# Patient Record
Sex: Female | Born: 1970 | Race: White | Hispanic: Yes | Smoking: Former smoker
Health system: Southern US, Community
[De-identification: ages and names within clinical notes are randomized; demographics above are authoritative.]

## PROBLEM LIST (undated history)

## (undated) DIAGNOSIS — D509 Iron deficiency anemia, unspecified: Secondary | ICD-10-CM

## (undated) DIAGNOSIS — K5792 Diverticulitis of intestine, part unspecified, without perforation or abscess without bleeding: Secondary | ICD-10-CM

## (undated) DIAGNOSIS — Z8669 Personal history of other diseases of the nervous system and sense organs: Secondary | ICD-10-CM

## (undated) DIAGNOSIS — N840 Polyp of corpus uteri: Secondary | ICD-10-CM

## (undated) DIAGNOSIS — G510 Bell's palsy: Secondary | ICD-10-CM

## (undated) DIAGNOSIS — C439 Malignant melanoma of skin, unspecified: Secondary | ICD-10-CM

## (undated) DIAGNOSIS — Z86006 Personal history of melanoma in-situ: Secondary | ICD-10-CM

## (undated) DIAGNOSIS — Z8679 Personal history of other diseases of the circulatory system: Secondary | ICD-10-CM

## (undated) DIAGNOSIS — L309 Dermatitis, unspecified: Secondary | ICD-10-CM

## (undated) HISTORY — PX: ESOPHAGOGASTRODUODENOSCOPY: SHX1529

## (undated) HISTORY — DX: Dermatitis, unspecified: L30.9

## (undated) HISTORY — DX: Diverticulitis of intestine, part unspecified, without perforation or abscess without bleeding: K57.92

---
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
## 2018-10-18 LAB — LIPID PANEL
Cholesterol: 173 (ref 0–200)
HDL: 57 (ref 35–70)
LDL Cholesterol: 97
Triglycerides: 95 (ref 40–160)

## 2019-01-17 ENCOUNTER — Encounter: Payer: Self-pay | Admitting: Family Medicine

## 2019-01-17 ENCOUNTER — Ambulatory Visit (INDEPENDENT_AMBULATORY_CARE_PROVIDER_SITE_OTHER): Payer: Managed Care, Other (non HMO) | Admitting: Family Medicine

## 2019-01-17 ENCOUNTER — Other Ambulatory Visit: Payer: Self-pay

## 2019-01-17 DIAGNOSIS — Z85820 Personal history of malignant melanoma of skin: Secondary | ICD-10-CM | POA: Diagnosis not present

## 2019-01-17 DIAGNOSIS — C439 Malignant melanoma of skin, unspecified: Secondary | ICD-10-CM

## 2019-01-17 DIAGNOSIS — Z1239 Encounter for other screening for malignant neoplasm of breast: Secondary | ICD-10-CM

## 2019-01-17 NOTE — Progress Notes (Signed)
Virtual Visit via Video Note  I connected with Megan Kelley   on 01/17/19 at  4:00 PM EDT by a video enabled telemedicine application and verified that I am speaking with the correct person using two identifiers.  Location patient: home Location provider:work office Persons participating in the virtual visit: patient, provider  I discussed the limitations of evaluation and management by telemedicine and the availability of in person appointments. The patient expressed understanding and agreed to proceed.   Megan Kelley DOB: 1971/01/17 Encounter date: 01/17/2019  This is a 48 y.o. female who presents to establish care. No chief complaint on file.   History of present illness: Just moved here 16 months ago.   Saw derm about a year ago and ended up having melanoma on top of foot; ended up getting further surgery for complete excision. [place]. Has been checking q 3 months.   Feels like eczema has always been somewhat dormant until moving here to [place]. Has noted things on forearms, calves.   Last mammogram was about 5 years ago.  Last pap was similar time frame.   bloodwork done from Feb for wellness check. Not sure all of what was checked.   Does beachbody for exercise.     Past Medical History:  Diagnosis Date  . Eczema   . Melanoma ([place])    top of right foot-treated by dermatologist   Past Surgical History:  Procedure Laterality Date  . MELANOMA EXCISION     No Known Allergies Current Meds  Medication Sig  . PRESCRIPTION MEDICATION Topical cream for eczema ( cannot recall name)   Social History   Tobacco Use  . Smoking status: Former Smoker    Packs/day: 0.10    Years: 20.00    Pack years: 2.00    Types: Cigarettes  . Smokeless tobacco: Never Used  Substance Use Topics  . Alcohol use: Yes    Alcohol/week: 6.0 standard drinks    Types: 3 Glasses of wine, 3 Cans of beer per week    Comment: ocaasionally   Family History  Problem Relation Age of Onset  .  Pancreatic cancer Father   . Breast cancer Maternal Grandmother 43  . Diabetes Mother        borderline  . Eczema Brother   . Hepatitis C Brother   . Other Brother        suicide     Review of Systems  Constitutional: Negative for chills, fatigue and fever.  Respiratory: Negative for cough, chest tightness, shortness of breath and wheezing.   Cardiovascular: Negative for chest pain, palpitations and leg swelling.    Objective:  LMP 01/17/2019 (Exact Date)       BP Readings from Last 3 Encounters:  No data found for BP   Wt Readings from Last 3 Encounters:  No data found for Wt    EXAM:  GENERAL: alert, oriented, appears well and in no acute distress  HEENT: atraumatic, conjunctiva clear, no obvious abnormalities on inspection of external nose and ears  NECK: normal movements of the head and neck  LUNGS: on inspection no signs of respiratory distress, breathing rate appears normal, no obvious gross SOB, gasping or wheezing  CV: no obvious cyanosis  MS: moves all visible extremities without noticeable abnormality  PSYCH/NEURO: pleasant and cooperative, no obvious depression or anxiety, speech and thought processing grossly intact  SKIN: no obvious facial/neck abnormality  Assessment/Plan  1. Screening for breast cancer - [facility] DIGITAL SCREENING BILATERAL; Future  2. Melanoma of  skin ([place]) Has followed with derm.     Return for physical exam.   I discussed the assessment and treatment plan with the patient. The patient was provided an opportunity to ask questions and all were answered. The patient agreed with the plan and demonstrated an understanding of the instructions.   The patient was advised to call back or seek an in-person evaluation if the symptoms worsen or if the condition fails to improve as anticipated.  I provided 25 minutes of non-face-to-face time during this encounter.   Megan Rough, MD

## 2019-01-20 ENCOUNTER — Telehealth: Payer: Self-pay | Admitting: *Deleted

## 2019-01-20 NOTE — Telephone Encounter (Signed)
-----   Message from Caren Macadam, MD sent at 01/17/2019  4:39 PM EDT ----- Please set up in person physical in office in next few months.

## 2019-01-20 NOTE — Telephone Encounter (Signed)
I called the pt and scheduled a physical for 8/19.  I also called the pt back and left a detailed message with the number to call the [facility] at [phone] to schedule a mammogram and our fax number of [phone] in order to submit her insurance card.

## 2019-03-26 ENCOUNTER — Other Ambulatory Visit (HOSPITAL_COMMUNITY)
Admission: RE | Admit: 2019-03-26 | Discharge: 2019-03-26 | Disposition: A | Discharge status: Home / Self Care | Payer: Managed Care, Other (non HMO) | Source: Ambulatory Visit | Attending: Family Medicine | Admitting: Family Medicine

## 2019-03-26 ENCOUNTER — Other Ambulatory Visit: Payer: Self-pay

## 2019-03-26 ENCOUNTER — Ambulatory Visit
Admission: RE | Admit: 2019-03-26 | Discharge: 2019-03-26 | Disposition: A | Discharge status: Home / Self Care | Payer: 59 | Source: Ambulatory Visit | Attending: Family Medicine | Admitting: Family Medicine

## 2019-03-26 ENCOUNTER — Ambulatory Visit (INDEPENDENT_AMBULATORY_CARE_PROVIDER_SITE_OTHER): Payer: Managed Care, Other (non HMO) | Admitting: Family Medicine

## 2019-03-26 ENCOUNTER — Encounter: Payer: Self-pay | Admitting: Family Medicine

## 2019-03-26 VITALS — BP 124/68 | HR 96 | Temp 97.8°F | Ht 64.0 in | Wt 158.3 lb

## 2019-03-26 DIAGNOSIS — Z124 Encounter for screening for malignant neoplasm of cervix: Secondary | ICD-10-CM | POA: Diagnosis present

## 2019-03-26 DIAGNOSIS — C439 Malignant melanoma of skin, unspecified: Secondary | ICD-10-CM | POA: Diagnosis not present

## 2019-03-26 DIAGNOSIS — G514 Facial myokymia: Secondary | ICD-10-CM

## 2019-03-26 DIAGNOSIS — Z202 Contact with and (suspected) exposure to infections with a predominantly sexual mode of transmission: Secondary | ICD-10-CM

## 2019-03-26 DIAGNOSIS — Z1239 Encounter for other screening for malignant neoplasm of breast: Secondary | ICD-10-CM

## 2019-03-26 DIAGNOSIS — M2550 Pain in unspecified joint: Secondary | ICD-10-CM

## 2019-03-26 DIAGNOSIS — Z0000 Encounter for general adult medical examination without abnormal findings: Secondary | ICD-10-CM

## 2019-03-26 LAB — CBC WITH DIFFERENTIAL/PLATELET
Basophils Absolute: 0 10*3/uL (ref 0.0–0.1)
Basophils Relative: 0.7 % (ref 0.0–3.0)
Eosinophils Absolute: 0.1 10*3/uL (ref 0.0–0.7)
Eosinophils Relative: 1 % (ref 0.0–5.0)
HCT: 30.9 % — ABNORMAL LOW (ref 36.0–46.0)
Hemoglobin: 9.7 g/dL — ABNORMAL LOW (ref 12.0–15.0)
Lymphocytes Relative: 27.3 % (ref 12.0–46.0)
Lymphs Abs: 1.8 10*3/uL (ref 0.7–4.0)
MCHC: 31.4 g/dL (ref 30.0–36.0)
MCV: 68.1 fl — ABNORMAL LOW (ref 78.0–100.0)
Monocytes Absolute: 0.4 10*3/uL (ref 0.1–1.0)
Monocytes Relative: 6 % (ref 3.0–12.0)
Neutro Abs: 4.3 10*3/uL (ref 1.4–7.7)
Neutrophils Relative %: 65 % (ref 43.0–77.0)
Platelets: 428 10*3/uL — ABNORMAL HIGH (ref 150.0–400.0)
RBC: 4.54 Mil/uL (ref 3.87–5.11)
RDW: 17.1 % — ABNORMAL HIGH (ref 11.5–15.5)
WBC: 6.6 10*3/uL (ref 4.0–10.5)

## 2019-03-26 LAB — C-REACTIVE PROTEIN: CRP: <1 mg/dL (ref 0.5–20.0)

## 2019-03-26 LAB — VITAMIN B12: Vitamin B-12: 255 pg/mL (ref 211–911)

## 2019-03-26 LAB — TSH: TSH: 2.21 u[IU]/mL (ref 0.35–4.50)

## 2019-03-26 LAB — SEDIMENTATION RATE: Sed Rate: 18 mm/hr (ref 0–20)

## 2019-03-26 NOTE — Progress Notes (Signed)
Megan Kelley DOB: May 28, 1971 Encounter date: 03/26/2019  This is a 48 y.o. female who presents for complete physical   History of present illness/Additional concerns: GERD: triggered by diet/spicy foods. Not regular problem for her (more issue from [place])  Hx of htn - in 2018 lost 20lbs and has stayed about same weight since then. BP has been good since this.   Got bells palsy after flu shot in 2012. Hasn't had flu shot since. Sx for a month; left side of face.   More sinus sx since moving here.   Having more joint issues- some neck pain, jumps from joint to joint. Not red, no morning stiffness. Left hip does bother her. Feels like she gets more tender week before menses. Knee and ankle do not bother her much.   Past Medical History:  Diagnosis Date  . Bell's palsy 2012  . Eczema   . Melanoma ([place])    top of right foot-treated by dermatologist   Past Surgical History:  Procedure Laterality Date  . MELANOMA EXCISION     No Known Allergies Current Meds  Medication Sig  . PRESCRIPTION MEDICATION Topical cream for eczema ( cannot recall name)   Social History   Tobacco Use  . Smoking status: Former Smoker    Packs/day: 0.10    Years: 20.00    Pack years: 2.00    Types: Cigarettes  . Smokeless tobacco: Never Used  Substance Use Topics  . Alcohol use: Yes    Alcohol/week: 6.0 standard drinks    Types: 3 Glasses of wine, 3 Cans of beer per week    Comment: ocaasionally   Family History  Problem Relation Age of Onset  . Pancreatic cancer Father   . Cancer Father   . Breast cancer Maternal Grandmother 61  . Cancer Maternal Grandmother   . Diabetes Mother        borderline  . Alcohol abuse Mother   . Arthritis Mother   . Depression Mother   . Hypertension Mother   . Learning disabilities Mother   . Alcohol abuse Maternal Grandfather   . Eczema Brother   . Hepatitis C Brother   . Other Brother        suicide  . Depression Brother   . Drug abuse Brother    . Early death Brother   . Learning disabilities Brother      Review of Systems  Constitutional: Negative for activity change, appetite change, chills, fatigue, fever and unexpected weight change.  HENT: Negative for congestion, ear pain, hearing loss, sinus pressure, sinus pain, sore throat and trouble swallowing.   Eyes: Negative for pain and visual disturbance.  Respiratory: Negative for cough, chest tightness, shortness of breath and wheezing.   Cardiovascular: Negative for chest pain, palpitations and leg swelling.  Gastrointestinal: Negative for abdominal pain, blood in stool, constipation, diarrhea, nausea and vomiting.  Genitourinary: Negative for difficulty urinating and menstrual problem (periods are 7-8 days. Heavy first 1-2 with changing required q 2 hours. LMP was 2 weeks ago).  Musculoskeletal: Positive for arthralgias (shoulders, hips mostly) and neck pain (on and off). Negative for back pain.  Skin: Negative for rash.  Neurological: Negative for dizziness, weakness, numbness and headaches.  Hematological: Negative for adenopathy. Does not bruise/bleed easily.  Psychiatric/Behavioral: Negative for sleep disturbance and suicidal ideas. The patient is not nervous/anxious.        Has had some adjusting since moving here.     CBC:  Lab Results  Component Value  Date   WBC 6.6 03/26/2019   HGB 9.7 (L) 03/26/2019   HCT 30.9 (L) 03/26/2019   MCHC 31.4 03/26/2019   RDW 17.1 (H) 03/26/2019   PLT 428.0 (H) 03/26/2019   CMP:No results found for: NA, K, CL, CO2, ANIONGAP, GLUCOSE, BUN, CREATININE, LABGLOB, GFRAA, CALCIUM, PROT, AGRATIO, BILITOT, ALKPHOS, ALT, AST, GLOB LIPID:No results found for: CHOL, TRIG, HDL, LDLCALC, LABVLDL  Objective:  BP 124/68 (BP Location: Right Arm, Patient Position: Sitting, Cuff Size: Normal)   Pulse 96   Temp 97.8 F (36.6 C) (Temporal)   Ht 5\' 4"  (1.626 m)   Wt 158 lb 4.8 oz (71.8 kg)   SpO2 97%   BMI 27.17 kg/m   Weight: 158 lb 4.8 oz  (71.8 kg)   BP Readings from Last 3 Encounters:  03/26/19 124/68   Wt Readings from Last 3 Encounters:  03/26/19 158 lb 4.8 oz (71.8 kg)    Physical Exam Exam conducted with a chaperone present.  Genitourinary:    General: Normal vulva.     Exam position: Supine.     Labia:        Right: No rash, tenderness or lesion.        Left: No rash, tenderness or lesion.      Urethra: No prolapse, urethral pain, urethral swelling or urethral lesion.     Vagina: Normal.     Cervix: Discharge (clear, mucous) present.     Adnexa: Right adnexa normal.     Comments: Cervix difficult to see with small speculum due to vaginal wall weakness. Medium speculum made exam easier.   anteverted uterus, slightly enlarged. Psychiatric:     Comments: Has had a lot of changes since moving her from [place]. Not in current relationship, but is new to having females as sexual partners/relationships and so isn't sure how to completely navigate this new world. She has good friend group and support system. Daughter is also close by and good support system.      Assessment/Plan: Health Maintenance Due  Topic Date Due  . HIV Screening  03/25/1985  . TETANUS/TDAP  03/25/1989  . PAP SMEAR-Modifier  03/26/1991  . INFLUENZA VACCINE  03/08/2019   Health Maintenance reviewed. Completed mammogram this morning.   1. Well adult exam Keep up with daily exercise. Will call with bloodwork results once obtained. Avoiding flu shot due to ?rxn in past/bells palsy after injection.   2. Multiple joint pain - ANA; Future - CBC with Differential/Platelet; Future - C-reactive protein; Future - Sedimentation rate; Future - Rheumatoid factor; Future - Rheumatoid factor - Sedimentation rate - C-reactive protein - CBC with Differential/Platelet - ANA  3. Possible exposure to STD - HIV Antibody (routine testing w rflx); Future - RPR; Future - Hepatitis C antibody; Future - Hepatitis C antibody - RPR - HIV Antibody  (routine testing w rflx)  4. Melanoma of skin [facility]) Following with dermatology  5. Facial twitching Residual effect after bells palsy; make sure no other contributing factors. - TSH; Future - Vitamin B12; Future - Magnesium, RBC; Future - Magnesium, RBC - Vitamin B12 - TSH  6. Screening for cervical cancer - PAP [[place]]  Return pending bloodwork.Micheline Rough, MD

## 2019-03-27 ENCOUNTER — Other Ambulatory Visit (INDEPENDENT_AMBULATORY_CARE_PROVIDER_SITE_OTHER): Payer: Managed Care, Other (non HMO)

## 2019-03-27 ENCOUNTER — Other Ambulatory Visit: Payer: Managed Care, Other (non HMO)

## 2019-03-27 ENCOUNTER — Ambulatory Visit: Payer: Self-pay

## 2019-03-27 DIAGNOSIS — D649 Anemia, unspecified: Secondary | ICD-10-CM

## 2019-03-27 LAB — CYTOLOGY - PAP
Adequacy: ABSENT
Chlamydia: NEGATIVE
Diagnosis: NEGATIVE
HPV: NOT DETECTED
Neisseria Gonorrhea: NEGATIVE

## 2019-03-27 LAB — IBC + FERRITIN
Ferritin: 2.7 ng/mL — ABNORMAL LOW (ref 10.0–291.0)
Iron: 21 ug/dL — ABNORMAL LOW (ref 42–145)
Saturation Ratios: 3 % — ABNORMAL LOW (ref 20.0–50.0)
Transferrin: 494 mg/dL — ABNORMAL HIGH (ref 212.0–360.0)

## 2019-03-27 NOTE — Telephone Encounter (Signed)
r Provided mammogram result per Dr. Ethlyn Gallery . Voiced understanding.

## 2019-03-28 ENCOUNTER — Other Ambulatory Visit: Payer: Self-pay | Admitting: Family Medicine

## 2019-03-28 DIAGNOSIS — N924 Excessive bleeding in the premenopausal period: Secondary | ICD-10-CM

## 2019-03-28 LAB — HEPATITIS C ANTIBODY
Hepatitis C Ab: NONREACTIVE
SIGNAL TO CUT-OFF: 0.01

## 2019-03-28 LAB — RHEUMATOID FACTOR: Rhuematoid fact SerPl-aCnc: <14 IU/mL (ref <14)

## 2019-03-28 LAB — RPR: RPR Ser Ql: NONREACTIVE

## 2019-03-28 LAB — ANTI-NUCLEAR AB-TITER (ANA TITER): ANA Titer 1: 1:80 {titer} — ABNORMAL HIGH

## 2019-03-28 LAB — HIV ANTIBODY (ROUTINE TESTING W REFLEX): HIV 1&2 Ab, 4th Generation: NONREACTIVE

## 2019-03-28 LAB — MAGNESIUM, RBC: Magnesium RBC: 4.7 mg/dL (ref 4.0–6.4)

## 2019-03-28 LAB — ANA: Anti Nuclear Antibody (ANA): POSITIVE — AB

## 2019-03-28 MED ORDER — IRON (FERROUS SULFATE) 325 (65 FE) MG PO TABS: 1.0000 | ORAL_TABLET | Freq: Every day | ORAL | 0 refills | Status: DC

## 2019-03-28 NOTE — Progress Notes (Signed)
pel

## 2019-03-31 ENCOUNTER — Encounter: Payer: Self-pay | Admitting: Family Medicine

## 2019-04-07 ENCOUNTER — Ambulatory Visit
Admission: RE | Admit: 2019-04-07 | Discharge: 2019-04-07 | Disposition: A | Discharge status: Home / Self Care | Payer: Managed Care, Other (non HMO) | Source: Ambulatory Visit | Attending: Family Medicine | Admitting: Family Medicine

## 2019-04-07 DIAGNOSIS — N924 Excessive bleeding in the premenopausal period: Secondary | ICD-10-CM

## 2019-04-08 ENCOUNTER — Other Ambulatory Visit: Payer: Self-pay | Admitting: *Deleted

## 2019-04-08 ENCOUNTER — Encounter: Payer: Self-pay | Admitting: *Deleted

## 2019-04-08 DIAGNOSIS — D259 Leiomyoma of uterus, unspecified: Secondary | ICD-10-CM

## 2019-04-09 ENCOUNTER — Telehealth: Payer: Self-pay | Admitting: Family Medicine

## 2019-04-09 NOTE — Telephone Encounter (Signed)
Referral placed on 9/1 and patient was informed of this.

## 2019-04-09 NOTE — Telephone Encounter (Signed)
Reviewed lab results and physician's note with patient. Also, reviewed ultrasound note from provider, in detail. Has started the 325 mg ferrous sulfate supplement and will wait for GYN call.  Referral for gynecology needed. Routing to provider.    Error with Pryor Curia of this encounter-s/b Ihor Gully, RN Triage.

## 2019-04-18 ENCOUNTER — Telehealth: Payer: Self-pay | Admitting: Family Medicine

## 2019-04-18 NOTE — Telephone Encounter (Signed)
°  [facility]: left message for patient to call and schedule.  I called the patient on 9/11 [phone]) left msg to call the office no answer   Copied from [place] [id number]. Topic: Referral - Status >> Apr 16, 2019  9:37 AM Reyne Dumas L wrote: Reason for CRM:   Pt calling to see where her referral to the gynecologist stands. Pt can be reached at [phone]

## 2019-04-28 ENCOUNTER — Other Ambulatory Visit: Payer: Self-pay

## 2019-04-29 ENCOUNTER — Ambulatory Visit (INDEPENDENT_AMBULATORY_CARE_PROVIDER_SITE_OTHER): Payer: Managed Care, Other (non HMO) | Admitting: Obstetrics & Gynecology

## 2019-04-29 ENCOUNTER — Other Ambulatory Visit: Payer: Self-pay

## 2019-04-29 ENCOUNTER — Encounter: Payer: Self-pay | Admitting: Obstetrics & Gynecology

## 2019-04-29 VITALS — BP 140/80 | Ht 64.0 in | Wt 158.0 lb

## 2019-04-29 DIAGNOSIS — N920 Excessive and frequent menstruation with regular cycle: Secondary | ICD-10-CM | POA: Diagnosis not present

## 2019-04-29 DIAGNOSIS — D649 Anemia, unspecified: Secondary | ICD-10-CM

## 2019-04-29 DIAGNOSIS — R9389 Abnormal findings on diagnostic imaging of other specified body structures: Secondary | ICD-10-CM | POA: Diagnosis not present

## 2019-04-29 DIAGNOSIS — D219 Benign neoplasm of connective and other soft tissue, unspecified: Secondary | ICD-10-CM

## 2019-04-29 MED ORDER — NORETHINDRONE 0.35 MG PO TABS: 1.0000 | ORAL_TABLET | Freq: Every day | ORAL | 4 refills | Status: DC

## 2019-04-29 NOTE — Progress Notes (Signed)
Megan Kelley 11/21/1970 [id number]   History:    48 y.o. G1P1L1 Single  RP:  New patient presenting for Fibroids/Thickened Endometrium/Menorrhagia/Anemia  HPI: Heavy menstrual periods with secondary anemia.  Received IV Iron/currently on Iron tablets.  LMP 04/18/2019.  Pelvic US 04/07/2019 showed Fibroids, including provable SM Fibroid and Thickened Endometrial line.  Hb at 9.7 on 03/26/2019.  Past medical history,surgical history, family history and social history were all reviewed and documented in the EPIC chart.  Gynecologic History Patient's last menstrual period was 04/18/2019. Contraception: Same sex partners Last Pap: 03/2019.  Results were: Negative Last mammogram: 03/2019. Results were: Negative Bone Density: Never Colonoscopy: Never  Obstetric History OB History  Gravida Para Term Preterm AB Living  1 1       1   SAB TAB Ectopic Multiple Live Births               # Outcome Date GA Lbr Len/2nd Weight Sex Delivery Anes PTL Lv  1 Para              ROS: A ROS was performed and pertinent positives and negatives are included in the history.  GENERAL: No fevers or chills. HEENT: No change in vision, no earache, sore throat or sinus congestion. NECK: No pain or stiffness. CARDIOVASCULAR: No chest pain or pressure. No palpitations. PULMONARY: No shortness of breath, cough or wheeze. GASTROINTESTINAL: No abdominal pain, nausea, vomiting or diarrhea, melena or bright red blood per rectum. GENITOURINARY: No urinary frequency, urgency, hesitancy or dysuria. MUSCULOSKELETAL: No joint or muscle pain, no back pain, no recent trauma. DERMATOLOGIC: No rash, no itching, no lesions. ENDOCRINE: No polyuria, polydipsia, no heat or cold intolerance. No recent change in weight. HEMATOLOGICAL: No anemia or easy bruising or bleeding. NEUROLOGIC: No headache, seizures, numbness, tingling or weakness. PSYCHIATRIC: No depression, no loss of interest in normal activity or change in sleep pattern.     Exam:   BP 140/80   Ht 5\' 4"  (1.626 m)   Wt 158 lb (71.7 kg)   LMP 04/18/2019   BMI 27.12 kg/m   Body mass index is 27.12 kg/m.  General appearance : Well developed well nourished female. No acute distress  Pelvic: Vulva: Normal             Vagina: No gross lesions or discharge  Cervix: No gross lesions or discharge  Uterus  AV, increased in size 12-14 cm, nodular, non-tender and mobile  Adnexa  Without masses or tenderness  Anus: Normal  Pelvic US 04/07/2019:  Uterus  Measurements: 14.5 x 7.5 x 7.8 cm = volume: 441 mL. Heterogeneous myometrial echogenicity. Nabothian cyst at cervix. Multiple small uterine nodules are identified consistent with leiomyomata. These include a small posterior leiomyoma 2.8 x 2.3 x 2.4 cm which extend submucosal, and anterior RIGHT leiomyoma 2.0 x 2.1 x 1.9 cm, a 3.1 x 2.6 x 2.8 cm RIGHT lateral leiomyoma which extends submucosal, and a mid posterior subserosal leiomyoma 2.7 x 2.5 x 3.1 cm.  Endometrium  Thickness: 21 mm. Abnormal thickened and heterogeneous in appearance. No endometrial fluid.  Right ovary  Measurements: 4.1 x 2.7 x 3.0 cm = volume: 17.2 mL. Probable small hemorrhagic cyst 2.3 x 1.8 x 1.9 cm lacking internal blood flow on color Doppler imaging. No additional masses.  Left ovary  Measurements: 2.7 x 1.7 x 2.2 cm = volume: 5.3 mL. Normal morphology without mass  Other findings  No free pelvic fluid.  No adnexal masses.  IMPRESSION: Multiple small  uterine leiomyomata, 2 of which extend submucosal.  Abnormal thickening and heterogeneity of the endometrial complex measuring 21 mm.  If bleeding remains unresponsive to hormonal or medical therapy, focal lesion work-up with sonohysterogram should be considered. Endometrial biopsy should also be considered in pre-menopausal patients at high risk for endometrial carcinoma. (Ref: Radiological Reasoning: Algorithmic Workup of Abnormal Vaginal Bleeding  with Endovaginal Sonography and Sonohysterography. AJR 2008; [id number])  2.3 cm diameter suspected hemorrhagic cyst of the RIGHT ovary; short-interval follow up ultrasound in 6-12 weeks is recommended, preferably during the week following the patient's normal menses.  Hb 03/26/19 at 9.7   Assessment/Plan:  48 y.o. female for annual exam   1. Fibroids Small uterine fibroids including 2 fibroids with probable SM component.  Menorrhagia with secondary anemia on Iron supplement.  Will start on the Progestin only BCPs to control the menstrual flow.  No contraindication to the Progestin pill.  Usage/risks/benefits reviewed.  F/U with a Sonohysterogram to complete the investigation.  EBx as needed.  May need surgery if SM Myomas are confirmed.    2. Menorrhagia with regular cycle As above. - [place] Sonohysterogram; Future  3. Secondary anemia Continue on Iron tablets.  Adding a Progestin pill to decrease the menstrual flow.  Will repeat a CBC at f/u Sonohysto. - CBC future  4. Thickened endometrium Probable SM Fibroids.  R/O Polyps and Endometrial Hyperplasia/Ca.  F/U Sonohysto/Possible EBx. - [place] Sonohysterogram; Future  Other orders - ibuprofen (ADVIL) 200 MG tablet; Take 200 mg by mouth every 6 (six) hours as needed. - norethindrone (MICRONOR) 0.35 MG tablet; Take 1 tablet (0.35 mg total) by mouth daily.  Counseling on above issues and coordination of care >50% x 45 minutes.  Princess Bruins MD, 8:45 AM 04/29/2019

## 2019-04-30 ENCOUNTER — Encounter: Payer: Self-pay | Admitting: Obstetrics & Gynecology

## 2019-04-30 NOTE — Patient Instructions (Signed)
1. Fibroids Small uterine fibroids including 2 fibroids with probable SM component.  Menorrhagia with secondary anemia on Iron supplement.  Will start on the Progestin only BCPs to control the menstrual flow.  No contraindication to the Progestin pill.  Usage/risks/benefits reviewed.  F/U with a Sonohysterogram to complete the investigation.  EBx as needed.  May need surgery if SM Myomas are confirmed.    2. Menorrhagia with regular cycle As above. - [place] Sonohysterogram; Future  3. Secondary anemia Continue on Iron tablets.  Adding a Progestin pill to decrease the menstrual flow.  Will repeat a CBC at f/u Sonohysto. - CBC future  4. Thickened endometrium Probable SM Fibroids.  R/O Polyps and Endometrial Hyperplasia/Ca.  F/U Sonohysto/Possible EBx. - [place] Sonohysterogram; Future  Other orders - ibuprofen (ADVIL) 200 MG tablet; Take 200 mg by mouth every 6 (six) hours as needed. - norethindrone (MICRONOR) 0.35 MG tablet; Take 1 tablet (0.35 mg total) by mouth daily.  Michiel Cowboy, [place] un placer encontrarle hoy!

## 2019-05-26 ENCOUNTER — Other Ambulatory Visit: Payer: Self-pay

## 2019-05-27 ENCOUNTER — Ambulatory Visit (INDEPENDENT_AMBULATORY_CARE_PROVIDER_SITE_OTHER): Payer: Managed Care, Other (non HMO)

## 2019-05-27 ENCOUNTER — Other Ambulatory Visit: Payer: Self-pay | Admitting: Obstetrics & Gynecology

## 2019-05-27 ENCOUNTER — Ambulatory Visit (INDEPENDENT_AMBULATORY_CARE_PROVIDER_SITE_OTHER): Payer: Managed Care, Other (non HMO) | Admitting: Obstetrics & Gynecology

## 2019-05-27 ENCOUNTER — Telehealth: Payer: Self-pay

## 2019-05-27 ENCOUNTER — Encounter: Payer: Self-pay | Admitting: Obstetrics & Gynecology

## 2019-05-27 DIAGNOSIS — R9389 Abnormal findings on diagnostic imaging of other specified body structures: Secondary | ICD-10-CM | POA: Diagnosis not present

## 2019-05-27 DIAGNOSIS — N840 Polyp of corpus uteri: Secondary | ICD-10-CM

## 2019-05-27 DIAGNOSIS — D219 Benign neoplasm of connective and other soft tissue, unspecified: Secondary | ICD-10-CM

## 2019-05-27 DIAGNOSIS — N920 Excessive and frequent menstruation with regular cycle: Secondary | ICD-10-CM | POA: Diagnosis not present

## 2019-05-27 NOTE — Telephone Encounter (Signed)
Left message for patient to call me

## 2019-05-27 NOTE — Progress Notes (Signed)
    Megan Kelley 02/27/1971 [id number]        48 y.o.  G1P1L1 Single  RP: Menorrhagia/thickened endometrium for Sonohysto  HPI: Heavy menstrual periods with secondary anemia.  Received IV Iron/currently on Iron tablets.  LMP 04/18/2019.  Pelvic US 04/07/2019 showed Fibroids, including provable SM Fibroid and Thickened Endometrial line.  Hb at 9.7 on 03/26/2019.  Started on the Progestin-only BCP on 04/29/2019, no heavy bleeding since then.  No pelvic pain.   OB History  Gravida Para Term Preterm AB Living  1 1       1   SAB TAB Ectopic Multiple Live Births               # Outcome Date GA Lbr Len/2nd Weight Sex Delivery Anes PTL Lv  1 Para             Past medical history,surgical history, problem list, medications, allergies, family history and social history were all reviewed and documented in the EPIC chart.   Directed ROS with pertinent positives and negatives documented in the history of present illness/assessment and plan.  Exam:  There were no vitals filed for this visit. General appearance:  Normal  Pelvic US today: T/V and T/a images.  Comparison made with the outside pelvic ultrasound from April 07, 2019.  Enlarged anteverted uterus with several intramural and subserosal fibroids.  The overall uterine size is measured at 13.17 x 7.55 x 6.17 cm.  7 fibroids were measured the largest of which was 3.63 cm x 2.87 cm.  A 3.5 cm is pedunculated into the right adnexa and a 0.9 cm fibroid is adjacent to the endometrial cavity, possibly partly submucosal.  The endometrial lining is thinner than on the previous scan at 6.9 millimeter today.  A focal 1 cm echogenic mass is visualized in the lower uterine segment with a feeder vessel posteriorly.  Both ovaries are small with sparse follicles.  No free fluid in the posterior to the sac.   Assessment/Plan:  48 y.o. G1P1   1. Endometrial polyp Probable endometrial polyp measuring 1 cm with a feeder vessel posteriorly.  The lesion was  very well visualized by ultrasound and a sonohysterogram was not necessary.  The endometrial lining was otherwise normal.  The decision was made to proceed with a hysteroscopy, MyoSure excision of the endometrial polyp/possible excision of the submucosal fibroid and D&C.  A pamphlet and information were given to the patient.  She will follow-up for preop visit.  2. Menorrhagia with regular cycle Controled on the Progestin-only BCPs.  3. Fibroids IM Fibroids with a small possible partially SM Fibroid.  Counseling on above issues and coordination of care more than 50% for 15 minutes.  Princess Bruins MD, 9:04 AM 05/27/2019

## 2019-05-27 NOTE — Patient Instructions (Signed)
1. Endometrial polyp Probable endometrial polyp measuring 1 cm with a feeder vessel posteriorly.  The lesion was very well visualized by ultrasound and a sonohysterogram was not necessary.  The endometrial lining was otherwise normal.  The decision was made to proceed with a hysteroscopy, MyoSure excision of the endometrial polyp/possible excision of the submucosal fibroid and D&C.  A pamphlet and information were given to the patient.  She will follow-up for preop visit.  2. Menorrhagia with regular cycle Controled on the Progestin-only BCPs.  3. Fibroids IM Fibroids with a small possible partially SM Fibroid.  Megan Kelley, it was a pleasure seeing you today!

## 2019-05-28 NOTE — Telephone Encounter (Signed)
I called patient again and left another message. We have played "phone tag" several times. I had already told her and I reminded her that I am off Th/Fri. I told her that the available surgery times will remain the same until Monday when I can talk with her.  I told her I want to discuss her ins and what her prepymt amount is. I advised her Covid screen 4 days prior and has to quarantine until surgery after that. I gave her Nov 18 and Dec 14 as they are my next available dates for Dr. Marguerita Merles .  I told her to consider and I will talk with her on Monday.

## 2019-06-02 NOTE — Telephone Encounter (Signed)
Patient called earlier in my voice mail but said she would only be available until 10:45am. She states she is only available "when I am on the road".  I had just returned from being off Th/Fri so was not able to schedule surgery first thing this morning.  I called her this afternoon and asked her let me know if she wants to try to do this through voice mail. I can leave details and she can call me back.

## 2019-06-03 NOTE — Telephone Encounter (Signed)
I spoke with patient this morning and discussed her insurance benefits and her estimated surgery prepymt.  She has a very large deductible not yet met and after discussion thinks she would prefer to wait until early Jan to have surgery when she could meet deductible for the year.  Also, her open enrollment for insurance at work is coming up and she may change to a lesser deductible plan. I advised her as soon as she knows about changes in insurance to let me know and I will recalculated prepymt due.  I will call her as soon as I get the January schedule.

## 2019-07-08 ENCOUNTER — Other Ambulatory Visit: Payer: Self-pay

## 2019-07-08 ENCOUNTER — Telehealth: Payer: Self-pay

## 2019-07-08 NOTE — Telephone Encounter (Signed)
Spoke with patient and scheduled surgery for January. I reminded her of our previous conversation regarding estimated surgery prepayment due by one week prior to surgery. She said her insurance did not change.  I scheduled her for 08/12/2019 and scheduled her Covid screen accordingly. She was advised regarding need to quarantine from time of screening test until surgery.  Pre op with Dr. Marguerita Merles will be scheduled and Packet will be sent.

## 2019-07-21 ENCOUNTER — Encounter: Payer: Self-pay | Admitting: Anesthesiology

## 2019-08-04 ENCOUNTER — Encounter (HOSPITAL_BASED_OUTPATIENT_CLINIC_OR_DEPARTMENT_OTHER): Payer: Self-pay | Admitting: Obstetrics & Gynecology

## 2019-08-04 ENCOUNTER — Other Ambulatory Visit: Payer: Self-pay

## 2019-08-05 ENCOUNTER — Ambulatory Visit (INDEPENDENT_AMBULATORY_CARE_PROVIDER_SITE_OTHER): Payer: Managed Care, Other (non HMO) | Admitting: Obstetrics & Gynecology

## 2019-08-05 ENCOUNTER — Encounter: Payer: Self-pay | Admitting: Obstetrics & Gynecology

## 2019-08-05 DIAGNOSIS — D649 Anemia, unspecified: Secondary | ICD-10-CM | POA: Diagnosis not present

## 2019-08-05 DIAGNOSIS — D219 Benign neoplasm of connective and other soft tissue, unspecified: Secondary | ICD-10-CM

## 2019-08-05 DIAGNOSIS — N920 Excessive and frequent menstruation with regular cycle: Secondary | ICD-10-CM

## 2019-08-05 DIAGNOSIS — N840 Polyp of corpus uteri: Secondary | ICD-10-CM | POA: Diagnosis not present

## 2019-08-05 MED ORDER — NORETHINDRONE 0.35 MG PO TABS: 1.0000 | ORAL_TABLET | Freq: Every day | ORAL | 1 refills | Status: DC

## 2019-08-05 NOTE — Progress Notes (Signed)
Megan Kelley [birth year] [id number]        48 y.o.  G1P1L1 Televisit by phone.  Patient well identified.  Counseling between patient at her home and myself in my [place] office.  Counseling for 25 minutes.  RP: Preop Menorrhagia/Endometrial Polyp for HSC/Myosure Excision/D+C  HPI: Menorrhagia with Uterine Fibroids and an Endometrial Polyp per Pelvic US.  Secondary anemia.  On the Progestin Pill and Iron Sulfate supplements, but taking the iron pill only 3 x per week.  LMP recently was heavy again.  No current bleeding, no pelvic pain.  Intermittent hip/leg pain, more with her heavy menses, recommended to see an Orthopedist if persists after surgery.   OB History  Gravida Para Term Preterm AB Living  1 1       1   SAB TAB Ectopic Multiple Live Births               # Outcome Date GA Lbr Len/2nd Weight Sex Delivery Anes PTL Lv  1 Para             Past medical history,surgical history, problem list, medications, allergies, family history and social history were all reviewed and documented in the EPIC chart.   Directed ROS with pertinent positives and negatives documented in the history of present illness/assessment and plan.  Exam:  There were no vitals filed for this visit.  Pelvic US 05/27/19: T/V and T/A images. Comparison made with the outsidepelvic ultrasoundfromAugust 31, 2020. Enlarged anteverted uterus with several intramural and subserosal fibroids. The overall uterine size is measured at 13.17 x 7.55 x 6.17 cm. 7 fibroids were measured the largest of which was 3.63 cm x 2.87 cm. A3.5 cm is pedunculated into the right adnexa and a 0.9 cm fibroid is adjacent to the endometrial cavity, possibly partly submucosal. The endometrial lining is thinner than on the previous scan at 6.63millimeter today. A focal 1 cm echogenic mass is visualized in the lower uterine segment with a feeder vessel posteriorly. Both ovaries are small with sparse follicles. No free fluid in the  posterior to the sac.   Assessment/Plan:  48 y.o. G1P1   1. Endometrial polyp Intrauterine lesion measuring 1 cm compatible with an endometrial polyp on pelvic ultrasound.  Patient has uterine fibroids mostly intramural and subserosal, but a small 0.9 fibroid is adjacent to the endometrial cavity and could be partly submucosal.  Menorrhagia is not controlled on the progestin pill.  Decision to proceed with hysteroscopy, MyoSure excision, D&C.  Preop preparation, procedure with risks and benefits and postop precautions reviewed thoroughly with patient.  Patient will continue the progestin pill and increase the iron sulfate to at least 1 tablet daily until surgery.  Progestin pill represcribed.  2. Fibroids As above.  3. Menorrhagia with regular cycle We will recheck a CBC the morning of the surgery.  4. Secondary anemia As above.  Other orders - norethindrone (MICRONOR) 0.35 MG tablet; Take 1 tablet (0.35 mg total) by mouth daily.                         Patient was counseled as to the risk of surgery to include the following:  1. Infection (prohylactic antibiotics will be administered)  2. DVT/Pulmonary Embolism (prophylactic pneumo compression stockings will be used)  3.Trauma to internal organs requiring additional surgical procedure to repair any injury to internal organs requiring perhaps additional hospitalization days.  4.Hemmorhage requiring transfusion and blood products which carry risks such as  anaphylactic reaction, hepatitis and AIDS  Patient had received literature information on the procedure scheduled and all her questions were answered and fully accepts all risk.   Princess Bruins MD, 4:12 PM 08/05/2019

## 2019-08-09 ENCOUNTER — Other Ambulatory Visit (HOSPITAL_COMMUNITY)
Admission: RE | Admit: 2019-08-09 | Discharge: 2019-08-09 | Disposition: A | Discharge status: Home / Self Care | Payer: Managed Care, Other (non HMO) | Source: Ambulatory Visit | Attending: Obstetrics & Gynecology | Admitting: Obstetrics & Gynecology

## 2019-08-09 DIAGNOSIS — Z01812 Encounter for preprocedural laboratory examination: Secondary | ICD-10-CM | POA: Insufficient documentation

## 2019-08-09 DIAGNOSIS — Z20822 Contact with and (suspected) exposure to covid-19: Secondary | ICD-10-CM | POA: Insufficient documentation

## 2019-08-09 LAB — SARS CORONAVIRUS 2 (TAT 6-24 HRS): SARS Coronavirus 2: NEGATIVE

## 2019-08-11 NOTE — Anesthesia Preprocedure Evaluation (Addendum)
Anesthesia Evaluation  Patient identified by MRN, date of birth, ID band Patient awake    Reviewed: Allergy & Precautions, NPO status , Patient's Chart, lab work & pertinent test results  Airway Mallampati: II  TM Distance: >3 FB Neck ROM: Full    Dental  (+) Teeth Intact, Dental Advisory Given   Pulmonary former smoker,    Pulmonary exam normal breath sounds clear to auscultation       Cardiovascular negative cardio ROS Normal cardiovascular exam Rhythm:Regular Rate:Normal     Neuro/Psych negative neurological ROS  negative psych ROS   GI/Hepatic negative GI ROS, Neg liver ROS,   Endo/Other  negative endocrine ROS  Renal/GU negative Renal ROS     Musculoskeletal negative musculoskeletal ROS (+)   Abdominal   Peds  Hematology negative hematology ROS (+)   Anesthesia Other Findings Day of surgery medications reviewed with the patient.  Reproductive/Obstetrics menometrorrhagia, endometrial polyp                            Anesthesia Physical Anesthesia Plan  ASA: II  Anesthesia Plan: General   Post-op Pain Management:    Induction: Intravenous  PONV Risk Score and Plan: 4 or greater and Midazolam, Diphenhydramine, Dexamethasone and Ondansetron  Airway Management Planned: LMA  Additional Equipment:   Intra-op Plan:   Post-operative Plan: Extubation in OR  Informed Consent: I have reviewed the patients History and Physical, chart, labs and discussed the procedure including the risks, benefits and alternatives for the proposed anesthesia with the patient or authorized representative who has indicated his/her understanding and acceptance.     Dental advisory given  Plan Discussed with: CRNA  Anesthesia Plan Comments:        Anesthesia Quick Evaluation

## 2019-08-12 ENCOUNTER — Ambulatory Visit (HOSPITAL_BASED_OUTPATIENT_CLINIC_OR_DEPARTMENT_OTHER): Payer: Managed Care, Other (non HMO) | Admitting: Anesthesiology

## 2019-08-12 ENCOUNTER — Ambulatory Visit (HOSPITAL_BASED_OUTPATIENT_CLINIC_OR_DEPARTMENT_OTHER)
Admission: RE | Admit: 2019-08-12 | Discharge: 2019-08-12 | Disposition: A | Discharge status: Home / Self Care | Payer: Managed Care, Other (non HMO) | Attending: Obstetrics & Gynecology | Admitting: Obstetrics & Gynecology

## 2019-08-12 ENCOUNTER — Encounter (HOSPITAL_BASED_OUTPATIENT_CLINIC_OR_DEPARTMENT_OTHER)
Admission: RE | Disposition: A | Discharge status: Home / Self Care | Payer: Self-pay | Source: Home / Self Care | Attending: Obstetrics & Gynecology

## 2019-08-12 ENCOUNTER — Encounter (HOSPITAL_BASED_OUTPATIENT_CLINIC_OR_DEPARTMENT_OTHER): Payer: Self-pay | Admitting: Obstetrics & Gynecology

## 2019-08-12 DIAGNOSIS — Z803 Family history of malignant neoplasm of breast: Secondary | ICD-10-CM | POA: Insufficient documentation

## 2019-08-12 DIAGNOSIS — D250 Submucous leiomyoma of uterus: Secondary | ICD-10-CM

## 2019-08-12 DIAGNOSIS — Z833 Family history of diabetes mellitus: Secondary | ICD-10-CM | POA: Insufficient documentation

## 2019-08-12 DIAGNOSIS — N840 Polyp of corpus uteri: Secondary | ICD-10-CM | POA: Insufficient documentation

## 2019-08-12 DIAGNOSIS — Z87891 Personal history of nicotine dependence: Secondary | ICD-10-CM | POA: Insufficient documentation

## 2019-08-12 DIAGNOSIS — N921 Excessive and frequent menstruation with irregular cycle: Secondary | ICD-10-CM | POA: Insufficient documentation

## 2019-08-12 HISTORY — DX: Personal history of melanoma in-situ: Z86.006

## 2019-08-12 HISTORY — DX: Polyp of corpus uteri: N84.0

## 2019-08-12 HISTORY — DX: Iron deficiency anemia, unspecified: D50.9

## 2019-08-12 HISTORY — DX: Personal history of other diseases of the nervous system and sense organs: Z86.69

## 2019-08-12 HISTORY — PX: DILATATION & CURETTAGE/HYSTEROSCOPY WITH MYOSURE: SHX6511

## 2019-08-12 HISTORY — DX: Personal history of other diseases of the circulatory system: Z86.79

## 2019-08-12 LAB — CBC
HCT: 33.6 % — ABNORMAL LOW (ref 36.0–46.0)
Hemoglobin: 10.7 g/dL — ABNORMAL LOW (ref 12.0–15.0)
MCH: 28.9 pg (ref 26.0–34.0)
MCHC: 31.8 g/dL (ref 30.0–36.0)
MCV: 90.8 fL (ref 80.0–100.0)
Platelets: 358 10*3/uL (ref 150–400)
RBC: 3.7 MIL/uL — ABNORMAL LOW (ref 3.87–5.11)
RDW: 15 % (ref 11.5–15.5)
WBC: 6.1 10*3/uL (ref 4.0–10.5)
nRBC: 0 % (ref 0.0–0.2)

## 2019-08-12 LAB — POCT PREGNANCY, URINE: Preg Test, Ur: NEGATIVE

## 2019-08-12 SURGERY — DILATATION & CURETTAGE/HYSTEROSCOPY WITH MYOSURE
Anesthesia: General | Site: Vagina

## 2019-08-12 MED ORDER — MIDAZOLAM HCL 2 MG/2ML IJ SOLN
INTRAMUSCULAR | Status: AC
  Filled 2019-08-12: qty 2

## 2019-08-12 MED ORDER — NORETHINDRONE 0.35 MG PO TABS: 1.0000 | ORAL_TABLET | Freq: Every day | ORAL | 4 refills | Status: DC

## 2019-08-12 MED ORDER — LACTATED RINGERS IV SOLN
INTRAVENOUS | Status: DC
  Filled 2019-08-12: qty 1000

## 2019-08-12 MED ORDER — CEFAZOLIN SODIUM-DEXTROSE 2-4 GM/100ML-% IV SOLN
2.0000 g | INTRAVENOUS | Status: AC
  Administered 2019-08-12: 08:00:00 2 g via INTRAVENOUS
  Filled 2019-08-12: qty 100

## 2019-08-12 MED ORDER — ACETAMINOPHEN 500 MG PO TABS
ORAL_TABLET | ORAL | Status: AC
  Filled 2019-08-12: qty 2

## 2019-08-12 MED ORDER — SODIUM CHLORIDE 0.9 % IR SOLN
Status: DC | PRN
  Administered 2019-08-12: 6000 mL

## 2019-08-12 MED ORDER — LIDOCAINE 2% (20 MG/ML) 5 ML SYRINGE
INTRAMUSCULAR | Status: AC
  Filled 2019-08-12: qty 5

## 2019-08-12 MED ORDER — CEFAZOLIN SODIUM-DEXTROSE 2-4 GM/100ML-% IV SOLN
INTRAVENOUS | Status: AC
  Filled 2019-08-12: qty 100

## 2019-08-12 MED ORDER — ONDANSETRON HCL 4 MG/2ML IJ SOLN
INTRAMUSCULAR | Status: DC | PRN
  Administered 2019-08-12: 4 mg via INTRAVENOUS

## 2019-08-12 MED ORDER — DEXAMETHASONE SODIUM PHOSPHATE 10 MG/ML IJ SOLN
INTRAMUSCULAR | Status: AC
  Filled 2019-08-12: qty 1

## 2019-08-12 MED ORDER — FENTANYL CITRATE (PF) 100 MCG/2ML IJ SOLN
25.0000 ug | INTRAMUSCULAR | Status: DC | PRN
  Filled 2019-08-12: qty 1

## 2019-08-12 MED ORDER — LIDOCAINE HCL (CARDIAC) PF 100 MG/5ML IV SOSY
PREFILLED_SYRINGE | INTRAVENOUS | Status: DC | PRN
  Administered 2019-08-12: 60 mg via INTRAVENOUS

## 2019-08-12 MED ORDER — LACTATED RINGERS IV SOLN
INTRAVENOUS | Status: DC
  Filled 2019-08-12 (×2): qty 1000

## 2019-08-12 MED ORDER — ONDANSETRON HCL 4 MG/2ML IJ SOLN
INTRAMUSCULAR | Status: AC
  Filled 2019-08-12: qty 2

## 2019-08-12 MED ORDER — PROMETHAZINE HCL 25 MG/ML IJ SOLN
6.2500 mg | INTRAMUSCULAR | Status: DC | PRN
  Filled 2019-08-12: qty 1

## 2019-08-12 MED ORDER — PROPOFOL 10 MG/ML IV BOLUS
INTRAVENOUS | Status: DC | PRN
  Administered 2019-08-12: 160 mg via INTRAVENOUS

## 2019-08-12 MED ORDER — LIDOCAINE HCL 1 % IJ SOLN
INTRAMUSCULAR | Status: DC | PRN
  Administered 2019-08-12: 20 mL

## 2019-08-12 MED ORDER — FENTANYL CITRATE (PF) 100 MCG/2ML IJ SOLN
INTRAMUSCULAR | Status: DC | PRN
  Administered 2019-08-12: 25 ug via INTRAVENOUS
  Administered 2019-08-12: 50 ug via INTRAVENOUS
  Administered 2019-08-12: 25 ug via INTRAVENOUS

## 2019-08-12 MED ORDER — KETOROLAC TROMETHAMINE 30 MG/ML IJ SOLN
INTRAMUSCULAR | Status: AC
  Filled 2019-08-12: qty 1

## 2019-08-12 MED ORDER — ACETAMINOPHEN 500 MG PO TABS
1000.0000 mg | ORAL_TABLET | Freq: Once | ORAL | Status: AC
  Administered 2019-08-12: 1000 mg via ORAL
  Filled 2019-08-12: qty 2

## 2019-08-12 MED ORDER — KETOROLAC TROMETHAMINE 30 MG/ML IJ SOLN
INTRAMUSCULAR | Status: DC | PRN
  Administered 2019-08-12: 30 mg via INTRAVENOUS

## 2019-08-12 MED ORDER — FENTANYL CITRATE (PF) 100 MCG/2ML IJ SOLN
INTRAMUSCULAR | Status: AC
  Filled 2019-08-12: qty 2

## 2019-08-12 MED ORDER — PROPOFOL 10 MG/ML IV BOLUS
INTRAVENOUS | Status: AC
  Filled 2019-08-12: qty 40

## 2019-08-12 MED ORDER — MIDAZOLAM HCL 5 MG/5ML IJ SOLN
INTRAMUSCULAR | Status: DC | PRN
  Administered 2019-08-12: 2 mg via INTRAVENOUS

## 2019-08-12 MED ORDER — DEXAMETHASONE SODIUM PHOSPHATE 4 MG/ML IJ SOLN
INTRAMUSCULAR | Status: DC | PRN
  Administered 2019-08-12: 10 mg via INTRAVENOUS

## 2019-08-12 SURGICAL SUPPLY — 24 items
CANISTER SUCT 3000ML PPV (MISCELLANEOUS) ×3 IMPLANT
CATH ROBINSON RED A/P 16FR (CATHETERS) ×3 IMPLANT
COVER WAND RF STERILE (DRAPES) ×3 IMPLANT
DEVICE MYOSURE LITE (MISCELLANEOUS) ×3 IMPLANT
DEVICE MYOSURE REACH (MISCELLANEOUS) IMPLANT
DILATOR CANAL MILEX (MISCELLANEOUS) IMPLANT
ELECT REM PT RETURN 9FT ADLT (ELECTROSURGICAL)
ELECTRODE REM PT RTRN 9FT ADLT (ELECTROSURGICAL) IMPLANT
GAUZE 4X4 16PLY RFD (DISPOSABLE) ×3 IMPLANT
GLOVE BIO SURGEON STRL SZ 6.5 (GLOVE) ×2 IMPLANT
GLOVE BIO SURGEONS STRL SZ 6.5 (GLOVE) ×1
GLOVE BIOGEL PI IND STRL 7.0 (GLOVE) ×2 IMPLANT
GLOVE BIOGEL PI INDICATOR 7.0 (GLOVE) ×4
GOWN STRL REUS W/TWL LRG LVL3 (GOWN DISPOSABLE) ×6 IMPLANT
IV NS IRRIG 3000ML ARTHROMATIC (IV SOLUTION) ×6 IMPLANT
KIT PROCEDURE FLUENT (KITS) ×3 IMPLANT
MYOSURE XL FIBROID (MISCELLANEOUS)
PACK VAGINAL MINOR WOMEN LF (CUSTOM PROCEDURE TRAY) ×3 IMPLANT
PAD OB MATERNITY 4.3X12.25 (PERSONAL CARE ITEMS) ×3 IMPLANT
PAD PREP 24X48 CUFFED NSTRL (MISCELLANEOUS) ×3 IMPLANT
SEAL CERVICAL OMNI LOK (ABLATOR) IMPLANT
SEAL ROD LENS SCOPE MYOSURE (ABLATOR) ×3 IMPLANT
SYSTEM TISS REMOVAL MYOSURE XL (MISCELLANEOUS) IMPLANT
TOWEL OR 17X26 10 PK STRL BLUE (TOWEL DISPOSABLE) ×3 IMPLANT

## 2019-08-12 NOTE — Anesthesia Postprocedure Evaluation (Signed)
Anesthesia Post Note  Patient: Megan Kelley  Procedure(s) Performed: DILATATION & CURETTAGE/HYSTEROSCOPY WITH MYOSURE (N/A Vagina )     Patient location during evaluation: PACU Anesthesia Type: General Level of consciousness: awake and alert Pain management: pain level controlled Vital Signs Assessment: post-procedure vital signs reviewed and stable Respiratory status: spontaneous breathing, nonlabored ventilation and respiratory function stable Cardiovascular status: blood pressure returned to baseline and stable Postop Assessment: no apparent nausea or vomiting Anesthetic complications: no    Last Vitals:  Vitals:   08/12/19 0845 08/12/19 0900  BP: 134/77 128/87  Pulse: 61 (!) 59  Resp: 12 14  Temp:  36.5 C  SpO2: 100% 99%    Last Pain:  Vitals:   08/12/19 0900  TempSrc:   PainSc: 0-No pain                 Catalina Gravel

## 2019-08-12 NOTE — H&P (Signed)
Megan Kelley is an 49 y.o. female.G1P1L1   RP: Menorrhagia/Endometrial Polyp for HSC/Myosure Excision/D+C  HPI: No change x last visit:  Not currently bleeding vaginally.  Menorrhagia with Uterine Fibroids and an Endometrial Polyp per Pelvic US.  Secondary anemia.  On the Progestin Pill and Iron Sulfate supplements, but taking the iron pill only 3 x per week.  LMP recently was heavy again.  No pelvic pain.  Intermittent hip/leg pain, more with her heavy menses, recommended to see an Orthopedist if persists after surgery.  Menstrual History: Patient's last menstrual period was 07/30/2019 (exact date).    Past Medical History:  Diagnosis Date  . Eczema   . Endometrial polyp   . History of Bell's palsy    2012 left side ,  resolved  . History of hypertension    08-04-2019  per pt over 10 yrs took medication briefly , lost wt and watch diet , no more medication since  . 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 IDA (iron deficiency anemia)     Past Surgical History:  Procedure Laterality Date  . ESOPHAGOGASTRODUODENOSCOPY  2012 approx.    Family History  Problem Relation Age of Onset  . Pancreatic cancer Father   . Cancer Father   . Breast cancer Maternal Grandmother 27  . Cancer Maternal Grandmother   . Diabetes Mother        borderline  . Alcohol abuse Mother   . Arthritis Mother   . Depression Mother   . Hypertension Mother   . Learning disabilities Mother   . Alcohol abuse Maternal Grandfather   . Eczema Brother   . Hepatitis C Brother   . Other Brother        suicide  . Depression Brother   . Drug abuse Brother   . Early death Brother   . Learning disabilities Brother     Social History:  reports that she quit smoking about 5 years ago. Her smoking use included cigarettes. She has a 2.00 pack-year smoking history. She has never used smokeless tobacco. She reports current alcohol use of about 6.0 standard drinks of  alcohol per week. She reports previous drug use.  Allergies: No Known Allergies  Medications Prior to Admission  Medication Sig Dispense Refill Last Dose  . ibuprofen (ADVIL) 200 MG tablet Take 200 mg by mouth every 6 (six) hours as needed.   Past Month at Unknown time  . Iron, Ferrous Sulfate, 325 (65 Fe) MG TABS Take 1 tablet by mouth daily. (Patient taking differently: Take 1 tablet by mouth 4 (four) times a week. ) 90 tablet 0 08/11/2019 at Unknown time  . naproxen sodium (ALEVE) 220 MG tablet Take 220 mg by mouth as needed.   Past Month at Unknown time  . PRESCRIPTION MEDICATION Topical cream for eczema ( cannot recall name)   Past Week at Unknown time  . norethindrone (MICRONOR) 0.35 MG tablet Take 1 tablet (0.35 mg total) by mouth daily. (Patient taking differently: Take 1 tablet by mouth daily. ) 1 Package 1 08/09/2019    REVIEW OF SYSTEMS: A ROS was performed and pertinent positives and negatives are included in the history.  GENERAL: No fevers or chills. HEENT: No change in vision, no earache, sore throat or sinus congestion. NECK: No pain or stiffness. CARDIOVASCULAR: No chest pain or pressure. No palpitations. PULMONARY: No shortness of breath, cough or wheeze. GASTROINTESTINAL: No abdominal pain, nausea, vomiting  or diarrhea, melena or bright red blood per rectum. GENITOURINARY: No urinary frequency, urgency, hesitancy or dysuria. MUSCULOSKELETAL: No joint or muscle pain, no back pain, no recent trauma. DERMATOLOGIC: No rash, no itching, no lesions. ENDOCRINE: No polyuria, polydipsia, no heat or cold intolerance. No recent change in weight. HEMATOLOGICAL: No anemia or easy bruising or bleeding. NEUROLOGIC: No headache, seizures, numbness, tingling or weakness. PSYCHIATRIC: No depression, no loss of interest in normal activity or change in sleep pattern.     Blood pressure 139/78, pulse 72, temperature 98 F (36.7 C), temperature source Oral, resp. rate 16, height 5\' 4"  (1.626 m), weight  74.3 kg, last menstrual period 07/30/2019, SpO2 100 %.  Physical Exam:  See office notes   Results for orders placed or performed during the hospital encounter of 08/12/19 (from the past 24 hour(s))  Pregnancy, urine POC     Status: None   Collection Time: 08/12/19  6:22 AM  Result Value Ref Range   Preg Test, Ur NEGATIVE NEGATIVE  CBC     Status: Abnormal   Collection Time: 08/12/19  6:22 AM  Result Value Ref Range   WBC 6.1 4.0 - 10.5 K/uL   RBC 3.70 (L) 3.87 - 5.11 MIL/uL   Hemoglobin 10.7 (L) 12.0 - 15.0 g/dL   HCT 33.6 (L) 36.0 - 46.0 %   MCV 90.8 80.0 - 100.0 fL   MCH 28.9 26.0 - 34.0 pg   MCHC 31.8 30.0 - 36.0 g/dL   RDW 15.0 11.5 - 15.5 %   Platelets 358 150 - 400 K/uL   nRBC 0.0 0.0 - 0.2 %    Covid Negative  Pelvic US 05/27/19: T/V and T/A images. Comparison made with the outsidepelvic ultrasoundfromAugust 31, 2020. Enlarged anteverted uterus with several intramural and subserosal fibroids. The overall uterine size is measured at 13.17 x 7.55 x 6.17 cm. 7 fibroids were measured the largest of which was 3.63 cm x 2.87 cm. A3.5 cm is pedunculated into the right adnexa and a 0.9 cm fibroid is adjacent to the endometrial cavity, possibly partly submucosal. The endometrial lining is thinner than on the previous scan at 6.75millimeter today. A focal 1 cm echogenic mass is visualized in the lower uterine segment with a feeder vessel posteriorly. Both ovaries are small with sparse follicles. No free fluid in the posterior to the sac.   Assessment/Plan:  49 y.o. G1P1   1. Endometrial polyp Intrauterine lesion measuring 1 cm compatible with an endometrial polyp on pelvic ultrasound.  Patient has uterine fibroids mostly intramural and subserosal, but a small 0.9 fibroid is adjacent to the endometrial cavity and could be partly submucosal.  Menorrhagia is not controlled on the progestin pill.  Decision to proceed with hysteroscopy, MyoSure excision, D&C.  Preop  preparation, procedure with risks and benefits and postop precautions reviewed thoroughly with patient.  Patient will continue the progestin pill and increase the iron sulfate to at least 1 tablet daily until surgery.  Progestin pill represcribed.  2. Fibroids As above.  3. Menorrhagia with regular cycle We will recheck a CBC the morning of the surgery.  4. Secondary anemia As above.  Other orders - norethindrone (MICRONOR) 0.35 MG tablet; Take 1 tablet (0.35 mg total) by mouth daily.                         Patient was counseled as to the risk of surgery to include the following:  1. Infection (prohylactic antibiotics will be  administered)  2. DVT/Pulmonary Embolism (prophylactic pneumo compression stockings will be used)  3.Trauma to internal organs requiring additional surgical procedure to repair any injury to internal organs requiring perhaps additional hospitalization days.  4.Hemmorhage requiring transfusion and blood products which carry risks such as anaphylactic reaction, hepatitis and AIDS  Patient had received literature information on the procedure scheduled and all her questions were answered and fully accepts all risk.     Marie-Lyne Dawon Troop 08/12/2019, 7:29 AM

## 2019-08-12 NOTE — Op Note (Signed)
Operative Note  08/12/2019  8:18 AM  PATIENT:  Megan Kelley  49 y.o. female  PRE-OPERATIVE DIAGNOSIS:  Menometrorrhagia, Endometrial polyp  POST-OPERATIVE DIAGNOSIS:  Menometrorrhagia, Endometrial polyp  PROCEDURE:  Procedure(s): HYSTEROSCOPY WITH MYOSURE EXCISION/DILATATION & CURETTAGE  SURGEON:  Surgeon(s): Princess Bruins, MD  ANESTHESIA:   general  FINDINGS: Endometrium thickened posteriorly with small Polyps.  DESCRIPTION OF OPERATION: Under general anesthesia with laryngeal mask, the patient is in lithotomy position.  She is prepped with Betadine on the suprapubic, vulvar and vaginal areas.  The bladder is catheterized.  The patient is draped as usual.  Timeout is done.  The vaginal exam reveals an anteverted uterus, slightly enlarged, mobile, no adnexal mass.  The speculum is inserted in the vagina and the anterior lip of the cervix is grasped with a tenaculum.  A paracervical block is done with lidocaine 1% a total of 20 cc at 4 and 8:00.  Dilation of the cervix with Pratt dilators up to #19 without difficulty.  The hysteroscope was inserted in the intra uterine cavity.  Inspection of the intrauterine cavity reveals 2 normal ostia.  The endometrium is thickened posteriorly with small polyps.  Pictures are taken.  The light MyoSure is inserted.  Excision of the thickened endometrium and small polyps.  Good hemostasis.  Pictures are taken after excision.  The hysteroscope with MyoSure are removed.  Systematic curettage of the endometrial cavity on all surfaces with a sharp curette.  The curette is removed.  Both specimens are sent together to pathology.  The tenaculum was removed from the cervix.  Silver nitrate is applied for hemostasis where the tenaculum was grasping on the cervix.  The speculum is removed.  The patient is brought to recovery room in good and stable status.  ESTIMATED BLOOD LOSS: 25 mL FLUID DEFICIT 645 mL  Intake/Output Summary (Last 24 hours) at 08/12/2019  0818 Last data filed at 08/12/2019 [id number] Gross per 24 hour  Intake --  Output 75 ml  Net -75 ml     BLOOD ADMINISTERED:none   LOCAL MEDICATIONS USED:  LIDOCAINE   SPECIMEN:  Source of Specimen:  Excision of Polyps/Thickened Endometrium and Endometrial Curettings  DISPOSITION OF SPECIMEN:  PATHOLOGY  COUNTS:  YES  PLAN OF CARE: Transfer to PACU  Marie-Lyne LavoieMD8:18 AM

## 2019-08-12 NOTE — Transfer of Care (Signed)
Immediate Anesthesia Transfer of Care Note  Patient: Megan Kelley  Procedure(s) Performed: Procedure(s) (LRB): DILATATION & CURETTAGE/HYSTEROSCOPY WITH MYOSURE (N/A)  Patient Location: PACU  Anesthesia Type: General  Level of Consciousness: awake, sedated, patient cooperative and responds to stimulation  Airway & Oxygen Therapy: Patient Spontanous Breathing and Patient connected to NC02 and soft FM   Post-op Assessment: Report given to PACU RN, Post -op Vital signs reviewed and stable and Patient moving all extremities  Post vital signs: Reviewed and stable  Complications: No apparent anesthesia complications

## 2019-08-12 NOTE — Discharge Instructions (Addendum)
Hysteroscopy, Care After This sheet gives you information about how to care for yourself after your procedure. Your health care provider may also give you more specific instructions. If you have problems or questions, contact your health care provider. What can I expect after the procedure? After the procedure, it is common to have:  Cramping.  Bleeding. This can vary from light spotting to menstrual-like bleeding. Follow these instructions at home: Activity  Rest for 1-2 days after the procedure.  Do not douche, use tampons, or have sex for 2 weeks after the procedure, or until your health care provider approves.  Do not drive for 24 hours after the procedure, or for as long as told by your health care provider.  Do not drive, use heavy machinery, or drink alcohol while taking prescription pain medicines. Medicines   Take over-the-counter and prescription medicines only as told by your health care provider.  Do not take aspirin during recovery. It can increase the risk of bleeding. General instructions  Do not take baths, swim, or use a hot tub until your health care provider approves. Take showers instead of baths for 2 weeks, or for as long as told by your health care provider.  To prevent or treat constipation while you are taking prescription pain medicine, your health care provider may recommend that you: ? Drink enough fluid to keep your urine clear or pale yellow. ? Take over-the-counter or prescription medicines. ? Eat foods that are high in fiber, such as fresh fruits and vegetables, whole grains, and beans. ? Limit foods that are high in fat and processed sugars, such as fried and sweet foods.  Keep all follow-up visits as told by your health care provider. This is important. Contact a health care provider if:  You feel dizzy or lightheaded.  You feel nauseous.  You have abnormal vaginal discharge.  You have a rash.  You have pain that does not get better with  medicine.  You have chills. Get help right away if:  You have bleeding that is heavier than a normal menstrual period.  You have a fever.  You have pain or cramps that get worse.  You develop new abdominal pain.  You faint.  You have pain in your shoulders.  You have shortness of breath. Summary  After the procedure, you may have cramping and some vaginal bleeding.  Do not douche, use tampons, or have sex for 2 weeks after the procedure, or until your health care provider approves.  Do not take baths, swim, or use a hot tub until your health care provider approves. Take showers instead of baths for 2 weeks, or for as long as told by your health care provider.  Report any unusual symptoms to your health care provider.  Keep all follow-up visits as told by your health care provider. This is important. This information is not intended to replace advice given to you by your health care provider. Make sure you discuss any questions you have with your health care provider. Document Revised: 07/06/2017 Document Reviewed: 08/22/2016 Elsevier Patient Education  [place] Instructions  Activity: Get plenty of rest for the remainder of the day. A responsible individual must stay with you for 24 hours following the procedure.  For the next 24 hours, DO NOT: -Drive a car -Paediatric nurse -Drink alcoholic beverages -Take any medication unless instructed by your physician -Make any legal decisions or sign important papers.  Meals: Start with liquid foods  foods such as gelatin or soup. Progress to regular foods as tolerated. Avoid greasy, spicy, heavy foods. If nausea and/or vomiting occur, drink only clear liquids until the nausea and/or vomiting subsides. Call your physician if vomiting continues.  Special Instructions/Symptoms: Your throat may feel dry or sore from the anesthesia or the breathing tube placed in your throat during surgery. If this  causes discomfort, gargle with warm salt water. The discomfort should disappear within 24 hours.       

## 2019-08-12 NOTE — Anesthesia Procedure Notes (Signed)
Procedure Name: LMA Insertion Date/Time: 08/12/2019 7:42 AM Performed by: Justice Rocher, CRNA Pre-anesthesia Checklist: Patient identified, Emergency Drugs available, Suction available and Patient being monitored Patient Re-evaluated:Patient Re-evaluated prior to induction Oxygen Delivery Method: Circle system utilized Preoxygenation: Pre-oxygenation with 100% oxygen Induction Type: IV induction Ventilation: Mask ventilation without difficulty LMA: LMA inserted LMA Size: 4.0 Number of attempts: 1 Airway Equipment and Method: Bite block Placement Confirmation: positive ETCO2 and breath sounds checked- equal and bilateral Tube secured with: Tape Dental Injury: Teeth and Oropharynx as per pre-operative assessment

## 2019-08-13 ENCOUNTER — Other Ambulatory Visit: Payer: Self-pay | Admitting: Family Medicine

## 2019-08-13 LAB — SURGICAL PATHOLOGY

## 2019-08-18 NOTE — Telephone Encounter (Signed)
Spoke with patient and informed her of pathology result. Post op  Appt scheduled.

## 2019-09-04 ENCOUNTER — Other Ambulatory Visit: Payer: Self-pay

## 2019-09-05 ENCOUNTER — Ambulatory Visit (INDEPENDENT_AMBULATORY_CARE_PROVIDER_SITE_OTHER): Payer: Managed Care, Other (non HMO) | Admitting: Obstetrics & Gynecology

## 2019-09-05 ENCOUNTER — Encounter: Payer: Self-pay | Admitting: Obstetrics & Gynecology

## 2019-09-05 VITALS — BP 138/80

## 2019-09-05 DIAGNOSIS — Z09 Encounter for follow-up examination after completed treatment for conditions other than malignant neoplasm: Secondary | ICD-10-CM

## 2019-09-05 DIAGNOSIS — N921 Excessive and frequent menstruation with irregular cycle: Secondary | ICD-10-CM

## 2019-09-05 MED ORDER — MEGESTROL ACETATE 40 MG PO TABS: 40.0000 mg | ORAL_TABLET | Freq: Two times a day (BID) | ORAL | 1 refills | Status: DC

## 2019-09-05 NOTE — Patient Instructions (Signed)
1. Status post gynecological surgery, follow-up exam Good postop evaluation without complication.  Pathology benign.  Patient reassured about surgery although her bleeding problem remains, with severe menometrorrhagia persisting post surgery in spite of continuing on the progestin only pill.  2. Menometrorrhagia Persistent menometrorrhagia post excision of small cysts submucosal myoma and taken proliferative endometrium.  Decision to start on Megace 40 mg twice a day to control the bleeding acutely.  Longer term management discussed with patient including Depo-Provera injections, Mirena IUD placement and hysterectomy.  At this point, given the previous failure with hormonal therapy, patient is leaning more towards hysterectomy.  If bleeding is controlled on Megace patient will think about her options and follow-up in about a month with decision.  If bleeding is not controlled she will come back earlier.  Other orders - megestrol (MEGACE) 40 MG tablet; Take 1 tablet (40 mg total) by mouth 2 (two) times daily.  [place], it was a pleasure seeing you today!

## 2019-09-05 NOTE — Progress Notes (Signed)
    Shalyn Derocco 1970-11-03 [id number]        49 y.o.  G1P1L1 Single  RP: Post HSC/Myosure Excision/D+C on 08/12/2019  HPI: Continued heavy frequent vaginal bleeding on the Progestin pill after HSC/Myosure Excision/D+C.  Pelvic cramping.  No abnormal discharge otherwise.  No fever.  Urine/BMs normal.   OB History  Gravida Para Term Preterm AB Living  1 1       1   SAB TAB Ectopic Multiple Live Births               # Outcome Date GA Lbr Len/2nd Weight Sex Delivery Anes PTL Lv  1 Para             Past medical history,surgical history, problem list, medications, allergies, family history and social history were all reviewed and documented in the EPIC chart.   Directed ROS with pertinent positives and negatives documented in the history of present illness/assessment and plan.  Exam:  Vitals:   09/05/19 1624  BP: 138/80   General appearance:  Normal  Abdomen: Normal  Gynecologic exam: Vulva normal.  Bimanual exam: Uterus is nodular mildly increased in volume, mobile, nontender.  No adnexal mass, nontender bilaterally.  Moderate dark menstrual blood present in the vagina with clots.  Patho 08/12/2019: FINAL MICROSCOPIC DIAGNOSIS:   A. ENDOMETRIUM, CURETTAGE:  - Proliferative endometrium.  - Smooth muscle fragments consistent with submucosal leiomyoma.  - No hyperplasia or malignancy.    Assessment/Plan:  49 y.o. G1P1   1. Status post gynecological surgery, follow-up exam Good postop evaluation without complication.  Pathology benign.  Patient reassured about surgery although her bleeding problem remains, with severe menometrorrhagia persisting post surgery in spite of continuing on the progestin only pill.  2. Menometrorrhagia Persistent menometrorrhagia post excision of small cysts submucosal myoma and taken proliferative endometrium.  Decision to start on Megace 40 mg twice a day to control the bleeding acutely.  Longer term management discussed with patient including  Depo-Provera injections, Mirena IUD placement and hysterectomy.  At this point, given the previous failure with hormonal therapy, patient is leaning more towards hysterectomy.  If bleeding is controlled on Megace patient will think about her options and follow-up in about a month with decision.  If bleeding is not controlled she will come back earlier.  Other orders - megestrol (MEGACE) 40 MG tablet; Take 1 tablet (40 mg total) by mouth 2 (two) times daily.  Princess Bruins MD, 4:28 PM 09/05/2019

## 2019-10-08 ENCOUNTER — Other Ambulatory Visit: Payer: Self-pay | Admitting: Family Medicine

## 2019-10-10 ENCOUNTER — Ambulatory Visit: Payer: Managed Care, Other (non HMO) | Admitting: Obstetrics & Gynecology

## 2019-10-14 ENCOUNTER — Other Ambulatory Visit: Payer: Self-pay

## 2019-10-15 ENCOUNTER — Ambulatory Visit (INDEPENDENT_AMBULATORY_CARE_PROVIDER_SITE_OTHER): Payer: 59 | Admitting: Obstetrics & Gynecology

## 2019-10-15 ENCOUNTER — Telehealth: Payer: Self-pay

## 2019-10-15 ENCOUNTER — Encounter: Payer: Self-pay | Admitting: Obstetrics & Gynecology

## 2019-10-15 VITALS — BP 128/80

## 2019-10-15 DIAGNOSIS — D649 Anemia, unspecified: Secondary | ICD-10-CM | POA: Diagnosis not present

## 2019-10-15 DIAGNOSIS — N921 Excessive and frequent menstruation with irregular cycle: Secondary | ICD-10-CM

## 2019-10-15 DIAGNOSIS — Z09 Encounter for follow-up examination after completed treatment for conditions other than malignant neoplasm: Secondary | ICD-10-CM

## 2019-10-15 DIAGNOSIS — D219 Benign neoplasm of connective and other soft tissue, unspecified: Secondary | ICD-10-CM | POA: Diagnosis not present

## 2019-10-15 NOTE — Progress Notes (Addendum)
    Megan Kelley Aug 30, 1970 [id number]        49 y.o.  G1P1L1   RP: Postop HSC/Myosure excision/D+C  HPI:  Post HSC Excision of SM Myoma with persistent Menometrorrhagia.  Desires Hysterectomy.   OB History  Gravida Para Term Preterm AB Living  1 1       1   SAB TAB Ectopic Multiple Live Births               # Outcome Date GA Lbr Len/2nd Weight Sex Delivery Anes PTL Lv  1 Para             Past medical history,surgical history, problem list, medications, allergies, family history and social history were all reviewed and documented in the EPIC chart.   Directed ROS with pertinent positives and negatives documented in the history of present illness/assessment and plan.  Exam:  Vitals:   10/15/19 0920  BP: 128/80   General appearance:  Normal  Abdomen: Normal  Gynecologic exam: Vulva normal.  Bimanual exam:   Patho 08/12/2019: FINAL MICROSCOPIC DIAGNOSIS: ENDOMETRIUM, CURETTAGE:  - Proliferative endometrium.  - Smooth muscle fragments consistent with submucosal leiomyoma.  - No hyperplasia or malignancy.   T/V and T/A images. Comparison made with the outsidepelvic ultrasoundfromAugust 31, 2020. Enlarged anteverted uterus with several intramural and subserosal fibroids. The overall uterine size is measured at 13.17 x 7.55 x 6.17 cm. 7 fibroids were measured the largest of which was 3.63 cm x 2.87 cm. A3.5 cm is pedunculated into the right adnexa and a 0.9 cm fibroid is adjacent to the endometrial cavity, possibly partly submucosal. The endometrial lining is thinner than on the previous scan at 6.30millimeter today. A focal 1 cm echogenic mass is visualized in the lower uterine segment with a feeder vessel posteriorly. Both ovaries are small with sparse follicles. No free fluid in the posterior to the sac.   Assessment/Plan:  49 y.o. G1P1   1. Status post gynecological surgery, follow-up exam Good postop healing, but continued heavy menses.  2.  Fibroids Uterine fibroids with menometrorrhagia and secondary anemia not improved after hysteroscopy take excision of a submucosal myoma.  Pathology benign.  Declines attempting hormonal control as it did not work in the past.  Depo-Provera and progesterone IUD reviewed.  Endometrial ablation discussed.  Decision to proceed with a hysterectomy.  Will use the robotic approach, schedule XI robotic total laparoscopic hysterectomy with bilateral salpingectomy.  Preserve vision of the ovaries recommended.  Patient voiced understanding and agreement with plan.  Will organize the surgery and a preop tele-visit.  3. Menometrorrhagia As above.  4. Secondary anemia Last hemoglobin January 2021 was at 10.7.  Recommend continuing on megestrol and iron supplement until surgery.  Princess Bruins MD, 9:30 AM 10/15/2019

## 2019-10-15 NOTE — Patient Instructions (Signed)
1. Status post gynecological surgery, follow-up exam Good postop healing, but continued heavy menses.  2. Fibroids Uterine fibroids with menometrorrhagia and secondary anemia not improved after hysteroscopy take excision of a submucosal myoma.  Pathology benign.  Declines attempting hormonal control as it did not work in the past.  Depo-Provera and progesterone IUD reviewed.  Endometrial ablation discussed.  Decision to proceed with a hysterectomy.  Will use the robotic approach, schedule XI robotic total laparoscopic hysterectomy with bilateral salpingectomy.  Preserve vision of the ovaries recommended.  Patient voiced understanding and agreement with plan.  Will organize the surgery and a preop tele-visit.  3. Menometrorrhagia As above.  4. Secondary anemia Last hemoglobin January 2021 was at 10.7.  Recommend continuing on megestrol and iron supplement until surgery.  Megan Kelley, it was a pleasure seeing you today!

## 2019-10-15 NOTE — Telephone Encounter (Signed)
I called patient and reviewed with her that she has met ded and OOP max on her insurance per [facility] benefits recording.  Her insurance should be paying her claims at 100%.  No need for surgery prepayment.  We discussed available dates and she chose 11/25/19 at 7:30am and I scheduled her for that at [facility].  I advised her of need for Covid screen pre-op and scheduled her accordingly.  I reviewed quarantine protocol with her. Sheet sent.  Pre op televisit scheduled and packed mailed.

## 2019-11-12 NOTE — Progress Notes (Signed)
PCP - Micheline Rough, MD Cardiologist -   Chest x-ray -  EKG -  Stress Test -  ECHO -  Cardiac Cath -   Sleep Study -  CPAP -   Fasting Blood Sugar -  Checks Blood Sugar _____ times a day  Blood Thinner Instructions: Aspirin Instructions: Last Dose:  Anesthesia review:   Patient denies shortness of breath, fever, cough and chest pain at PAT appointment   Patient verbalized understanding of instructions that were given to them at the PAT appointment. Patient was also instructed that they will need to review over the PAT instructions again at home before surgery.

## 2019-11-12 NOTE — Patient Instructions (Addendum)
YOU ARE SCHEDULED FOR A COVID TEST 11-21-19 @ 2:50 PM. THIS TEST MUST BE DONE BEFORE SURGERY. GO TO  [address], [place], [address] AND REMAIN IN YOUR CAR, THIS IS A DRIVE UP TEST. ONCE YOUR COVID TEST IS DONE PLEASE FOLLOW ALL THE QUARANTINE  INSTRUCTIONS GIVEN IN YOUR HANDOUT.      Your procedure is scheduled on 11-25-19    Report to [facility].   Call this number if you have problems the morning of surgery  :[phone].   OUR ADDRESS IS [place].  WE ARE LOCATED IN [facility].                                     REMEMBER: DO NOT EAT FOOD OR DRINK LIQUIDS AFTER MIDNIGHT .    YOU MAY  BRUSH YOUR TEETH MORNING OF SURGERY AND RINSE YOUR MOUTH OUT, NO CHEWING GUM CANDY OR MINTS.   TAKE THESE MEDICATIONS MORNING OF SURGERY WITH A SIP OF WATER: None  IF YOU ARE SPENDING THE NIGHT AFTER SURGERY PLEASE BRING ALL YOUR PRESCRIPTION MEDICATIONS IN THEIR ORIGINAL BOTTLES.   1 VISITOR IS ALLOWED IN WAITING ROOM ONLY DAY OF SURGERY. NO VISITOR MAY SPEND THE NIGHT. VISITOR ARE ALLOWED TO STAY UNTIL 8:00 PM.                                    DO NOT WEAR JEWERLY, MAKE UP, OR NAIL POLISH ON FINGERNAILS.  DO NOT WEAR LOTIONS, POWDERS, PERFUMES OR DEODORANT.  DO NOT SHAVE FOR 24 HOURS PRIOR TO DAY OF SURGERY.  CONTACTS, GLASSES, OR DENTURES MAY NOT BE WORN TO SURGERY.                                    [facility] IS NOT RESPONSIBLE  FOR ANY BELONGINGS.                                                                    [name] Kitchen                                       [facility] - Preparing for Surgery Before surgery, you can play an important role.  Because skin is not sterile, your skin needs to be as free of germs as possible.  You can reduce the number of germs on your skin by washing with CHG (chlorahexidine gluconate) soap before surgery.  CHG is an antiseptic cleaner which kills germs and bonds with the skin to continue killing  germs even after washing. Please DO NOT use if you have an allergy to CHG or antibacterial soaps.  If your skin becomes reddened/irritated stop using the CHG and inform your nurse when you arrive at Short Stay. Do not shave (including legs and underarms) for at least 48 hours prior to the first CHG shower.  You  may shave your face/neck. Please follow these instructions carefully:  1.  Shower with CHG Soap the night before surgery and the  morning of Surgery.  2.  If you choose to wash your hair, wash your hair first as usual with your  normal  shampoo.  3.  After you shampoo, rinse your hair and body thoroughly to remove the  shampoo.                           4.  Use CHG as you would any other liquid soap.  You can apply chg directly  to the skin and wash                       Gently with a scrungie or clean washcloth.  5.  Apply the CHG Soap to your body ONLY FROM THE NECK DOWN.   Do not use on face/ open                           Wound or open sores. Avoid contact with eyes, ears mouth and genitals (private parts).                       Wash face,  Genitals (private parts) with your normal soap.             6.  Wash thoroughly, paying special attention to the area where your surgery  will be performed.  7.  Thoroughly rinse your body with warm water from the neck down.  8.  DO NOT shower/wash with your normal soap after using and rinsing off  the CHG Soap.                9.  Pat yourself dry with a clean towel.            10.  Wear clean pajamas.            11.  Place clean sheets on your bed the night of your first shower and do not  sleep with pets. Day of Surgery : Do not apply any lotions/deodorants the morning of surgery.  Please wear clean clothes to the hospital/surgery center.  FAILURE TO FOLLOW THESE INSTRUCTIONS MAY RESULT IN THE CANCELLATION OF YOUR SURGERY PATIENT SIGNATURE_________________________________  NURSE  SIGNATURE__________________________________  ________________________________________________________________________   Megan Kelley  An incentive spirometer is a tool that can help keep your lungs clear and active. This tool measures how well you are filling your lungs with each breath. Taking long deep breaths may help reverse or decrease the chance of developing breathing (pulmonary) problems (especially infection) following:  A long period of time when you are unable to move or be active. BEFORE THE PROCEDURE   If the spirometer includes an indicator to show your best effort, your nurse or respiratory therapist will set it to a desired goal.  If possible, sit up straight or lean slightly forward. Try not to slouch.  Hold the incentive spirometer in an upright position. INSTRUCTIONS FOR USE  1. Sit on the edge of your bed if possible, or sit up as far as you can in bed or on a chair. 2. Hold the incentive spirometer in an upright position. 3. Breathe out normally. 4. Place the mouthpiece in your mouth and seal your lips tightly around it. 5. Breathe in slowly and as deeply as possible, raising the  piston or the ball toward the top of the column. 6. Hold your breath for 3-5 seconds or for as long as possible. Allow the piston or ball to fall to the bottom of the column. 7. Remove the mouthpiece from your mouth and breathe out normally. 8. Rest for a few seconds and repeat Steps 1 through 7 at least 10 times every 1-2 hours when you are awake. Take your time and take a few normal breaths between deep breaths. 9. The spirometer may include an indicator to show your best effort. Use the indicator as a goal to work toward during each repetition. 10. After each set of 10 deep breaths, practice coughing to be sure your lungs are clear. If you have an incision (the cut made at the time of surgery), support your incision when coughing by placing a pillow or rolled up towels firmly  against it. Once you are able to get out of bed, walk around indoors and cough well. You may stop using the incentive spirometer when instructed by your caregiver.  RISKS AND COMPLICATIONS  Take your time so you do not get dizzy or light-headed.  If you are in pain, you may need to take or ask for pain medication before doing incentive spirometry. It is harder to take a deep breath if you are having pain. AFTER USE  Rest and breathe slowly and easily.  It can be helpful to keep track of a log of your progress. Your caregiver can provide you with a simple table to help with this. If you are using the spirometer at home, follow these instructions: [place] IF:   You are having difficultly using the spirometer.  You have trouble using the spirometer as often as instructed.  Your pain medication is not giving enough relief while using the spirometer.  You develop fever of 100.5 F (38.1 C) or higher. SEEK IMMEDIATE MEDICAL CARE IF:   You cough up bloody sputum that had not been present before.  You develop fever of 102 F (38.9 C) or greater.  You develop worsening pain at or near the incision site. MAKE SURE YOU:   Understand these instructions.  Will watch your condition.  Will get help right away if you are not doing well or get worse. Document Released: 12/04/2006 Document Revised: 10/16/2011 Document Reviewed: 02/04/2007 [facility] Patient Information 2014 [place], [place].   ________________________________________________________________________

## 2019-11-13 ENCOUNTER — Telehealth: Payer: Self-pay

## 2019-11-13 NOTE — Telephone Encounter (Signed)
Discussed with patient that medical records release form needs to be signed and on file before I release forms with info to [employer] of ways to accomplish that.  Also, patient went ahead and paid $25 fee. I will wait for the FMLA forms and the med records release form.

## 2019-11-13 NOTE — Telephone Encounter (Signed)
Patient called because she has learned that she needs FMLA forms completed . I called her back and left message that she will need to complete med records release form/pay fee.  I asked her to call me back to discuss.

## 2019-11-14 DIAGNOSIS — Z0289 Encounter for other administrative examinations: Secondary | ICD-10-CM

## 2019-11-18 ENCOUNTER — Other Ambulatory Visit: Payer: Self-pay

## 2019-11-18 ENCOUNTER — Encounter: Payer: Self-pay | Admitting: Obstetrics & Gynecology

## 2019-11-18 ENCOUNTER — Telehealth (INDEPENDENT_AMBULATORY_CARE_PROVIDER_SITE_OTHER): Payer: 59 | Admitting: Obstetrics & Gynecology

## 2019-11-18 DIAGNOSIS — N921 Excessive and frequent menstruation with irregular cycle: Secondary | ICD-10-CM | POA: Diagnosis not present

## 2019-11-18 DIAGNOSIS — D649 Anemia, unspecified: Secondary | ICD-10-CM | POA: Diagnosis not present

## 2019-11-18 DIAGNOSIS — D219 Benign neoplasm of connective and other soft tissue, unspecified: Secondary | ICD-10-CM

## 2019-11-18 NOTE — Progress Notes (Signed)
Megan Kelley 1970-09-17 [id number]        49 y.o.  G1P1L1  Verbal consent obtained for preop televisit after identifying the patient well.  Televisit conducted between the patient at home and myself at my [place] office.  Counseling x 20 minutes.  RP: Fibroids with persistent Menometrorrhagia for Preop XI Robotic TLH/Bilateral Salpingectomy  HPI: Heavy menstrual periods with secondary anemia.  Received IV Iron/currently on Iron tablets.  Pelvic US 04/07/2019 showed Fibroids, including probable SM Fibroid and Thickened Endometrial line.  Hb at 9.7 on 03/26/2019.  Post HSC/Myosure Excision/D+C on 08/12/2019 with continued heavy frequent vaginal bleeding and pelvic cramping.  OB History  Gravida Para Term Preterm AB Living  1 1       1   SAB TAB Ectopic Multiple Live Births               # Outcome Date GA Lbr Len/2nd Weight Sex Delivery Anes PTL Lv  1 Para             Past medical history,surgical history, problem list, medications, allergies, family history and social history were all reviewed and documented in the EPIC chart.   Directed ROS with pertinent positives and negatives documented in the history of present illness/assessment and plan.  Exam:  There were no vitals filed for this visit. General appearance:  Normal  Televisit today.  Patho 08/12/2019: FINAL MICROSCOPIC DIAGNOSIS: ENDOMETRIUM, CURETTAGE:  - Proliferative endometrium.  - Smooth muscle fragments consistent with submucosal leiomyoma.  - No hyperplasia or malignancy.   T/V and T/A images 05/2019: Comparison made with the outsidepelvic ultrasoundfromAugust 31, 2020. Enlarged anteverted uterus with several intramural and subserosal fibroids. The overall uterine size is measured at 13.17 x 7.55 x 6.17 cm. 7 fibroids were measured the largest of which was 3.63 cm x 2.87 cm. A3.5 cm is pedunculated into the right adnexa and a 0.9 cm fibroid is adjacent to the endometrial cavity, possibly partly submucosal.  The endometrial lining is thinner than on the previous scan at 6.50millimeter today. A focal 1 cm echogenic mass is visualized in the lower uterine segment with a feeder vessel posteriorly. Both ovaries are small with sparse follicles. No free fluid in the posterior to the sac.   Assessment/Plan:  49 y.o. G1P1   1. Fibroids Uterine fibroids with menometrorrhagia and secondary anemia not improved after hysteroscopy with excision of a submucosal myoma.  Pathology benign.  Declines attempting hormonal control as it did not work in the past. Depo-Provera and progesterone IUD reviewed.  Endometrial ablation discussed.  Decision to proceed with a hysterectomy.  Will use the robotic approach, XI robotic total laparoscopic hysterectomy with bilateral salpingectomy scheduled.  Preservation of the ovaries recommended.  Patient voiced understanding and agreement with plan.   2. Menometrorrhagia As above.  3. Secondary anemia Last hemoglobin January 2021 was at 10.7.  Recommend continuing on megestrol and iron supplement until surgery.                        Patient was counseled as to the risk of surgery to include the following:  1. Infection (prohylactic antibiotics will be administered)  2. DVT/Pulmonary Embolism (prophylactic pneumo compression stockings will be used)  3.Trauma to internal organs requiring additional surgical procedure to repair any injury to internal organs requiring perhaps additional hospitalization days.  4.Hemmorhage requiring transfusion and blood products which carry risks such as anaphylactic reaction, hepatitis and AIDS  Patient had received literature information  on the procedure scheduled and all her questions were answered and fully accepts all risk.   Princess Bruins MD, 12:42 PM 11/18/2019

## 2019-11-19 ENCOUNTER — Encounter (HOSPITAL_COMMUNITY)
Admission: RE | Admit: 2019-11-19 | Discharge: 2019-11-19 | Disposition: A | Discharge status: Home / Self Care | Payer: 59 | Source: Ambulatory Visit | Attending: Obstetrics & Gynecology | Admitting: Obstetrics & Gynecology

## 2019-11-19 ENCOUNTER — Other Ambulatory Visit: Payer: Self-pay

## 2019-11-19 ENCOUNTER — Encounter (HOSPITAL_COMMUNITY): Payer: Self-pay

## 2019-11-19 DIAGNOSIS — I10 Essential (primary) hypertension: Secondary | ICD-10-CM | POA: Insufficient documentation

## 2019-11-19 DIAGNOSIS — Z01818 Encounter for other preprocedural examination: Secondary | ICD-10-CM | POA: Diagnosis not present

## 2019-11-19 LAB — CBC
HCT: 43.3 % (ref 36.0–46.0)
Hemoglobin: 14 g/dL (ref 12.0–15.0)
MCH: 29.5 pg (ref 26.0–34.0)
MCHC: 32.3 g/dL (ref 30.0–36.0)
MCV: 91.4 fL (ref 80.0–100.0)
Platelets: 312 10*3/uL (ref 150–400)
RBC: 4.74 MIL/uL (ref 3.87–5.11)
RDW: 12.3 % (ref 11.5–15.5)
WBC: 7.3 10*3/uL (ref 4.0–10.5)
nRBC: 0 % (ref 0.0–0.2)

## 2019-11-19 LAB — BASIC METABOLIC PANEL
Anion gap: 10 (ref 5–15)
BUN: 19 mg/dL (ref 6–20)
CO2: 26 mmol/L (ref 22–32)
Calcium: 9.4 mg/dL (ref 8.9–10.3)
Chloride: 103 mmol/L (ref 98–111)
Creatinine, Ser: 0.83 mg/dL (ref 0.44–1.00)
GFR calc Af Amer: >60 mL/min (ref >60)
GFR calc non Af Amer: >60 mL/min (ref >60)
Glucose, Bld: 97 mg/dL (ref 70–99)
Potassium: 4 mmol/L (ref 3.5–5.1)
Sodium: 139 mmol/L (ref 135–145)

## 2019-11-19 LAB — ABO/RH: ABO/RH(D): O POS

## 2019-11-20 ENCOUNTER — Encounter (HOSPITAL_COMMUNITY): Payer: 59

## 2019-11-21 ENCOUNTER — Other Ambulatory Visit (HOSPITAL_COMMUNITY)
Admission: RE | Admit: 2019-11-21 | Discharge: 2019-11-21 | Disposition: A | Discharge status: Home / Self Care | Payer: 59 | Source: Ambulatory Visit | Attending: Obstetrics & Gynecology | Admitting: Obstetrics & Gynecology

## 2019-11-21 DIAGNOSIS — Z20822 Contact with and (suspected) exposure to covid-19: Secondary | ICD-10-CM | POA: Diagnosis not present

## 2019-11-21 DIAGNOSIS — Z01812 Encounter for preprocedural laboratory examination: Secondary | ICD-10-CM | POA: Diagnosis not present

## 2019-11-21 LAB — SARS CORONAVIRUS 2 (TAT 6-24 HRS): SARS Coronavirus 2: NEGATIVE

## 2019-11-24 ENCOUNTER — Telehealth: Payer: Self-pay

## 2019-11-24 ENCOUNTER — Encounter: Payer: Self-pay | Admitting: Anesthesiology

## 2019-11-24 NOTE — Telephone Encounter (Signed)
I called patient to discuss date to start disability since she had pre op Covid test and quarantine was required. She said to start it today as she was off Friday.  After discussing with Dr. Marguerita Merles she said she plans to return to work on 12/15/19.  I filled out forms according and faxed them to [place] Records release form was already on file.

## 2019-11-25 ENCOUNTER — Encounter (HOSPITAL_BASED_OUTPATIENT_CLINIC_OR_DEPARTMENT_OTHER): Payer: Self-pay | Admitting: Obstetrics & Gynecology

## 2019-11-25 ENCOUNTER — Other Ambulatory Visit: Payer: Self-pay | Admitting: Obstetrics & Gynecology

## 2019-11-25 ENCOUNTER — Telehealth: Payer: Self-pay | Admitting: Obstetrics & Gynecology

## 2019-11-25 ENCOUNTER — Other Ambulatory Visit: Payer: Self-pay

## 2019-11-25 ENCOUNTER — Encounter (HOSPITAL_BASED_OUTPATIENT_CLINIC_OR_DEPARTMENT_OTHER)
Admission: RE | Disposition: A | Discharge status: Home / Self Care | Payer: Self-pay | Source: Home / Self Care | Attending: Obstetrics & Gynecology

## 2019-11-25 ENCOUNTER — Ambulatory Visit (HOSPITAL_BASED_OUTPATIENT_CLINIC_OR_DEPARTMENT_OTHER)
Admission: RE | Admit: 2019-11-25 | Discharge: 2019-11-25 | Disposition: A | Discharge status: Home / Self Care | Payer: 59 | Attending: Obstetrics & Gynecology | Admitting: Obstetrics & Gynecology

## 2019-11-25 ENCOUNTER — Ambulatory Visit (HOSPITAL_BASED_OUTPATIENT_CLINIC_OR_DEPARTMENT_OTHER): Payer: 59 | Admitting: Anesthesiology

## 2019-11-25 DIAGNOSIS — D509 Iron deficiency anemia, unspecified: Secondary | ICD-10-CM | POA: Insufficient documentation

## 2019-11-25 DIAGNOSIS — D252 Subserosal leiomyoma of uterus: Secondary | ICD-10-CM | POA: Insufficient documentation

## 2019-11-25 DIAGNOSIS — D649 Anemia, unspecified: Secondary | ICD-10-CM

## 2019-11-25 DIAGNOSIS — Z79899 Other long term (current) drug therapy: Secondary | ICD-10-CM | POA: Diagnosis not present

## 2019-11-25 DIAGNOSIS — Z87891 Personal history of nicotine dependence: Secondary | ICD-10-CM | POA: Insufficient documentation

## 2019-11-25 DIAGNOSIS — N921 Excessive and frequent menstruation with irregular cycle: Secondary | ICD-10-CM | POA: Insufficient documentation

## 2019-11-25 DIAGNOSIS — Z9889 Other specified postprocedural states: Secondary | ICD-10-CM

## 2019-11-25 DIAGNOSIS — D251 Intramural leiomyoma of uterus: Secondary | ICD-10-CM | POA: Diagnosis not present

## 2019-11-25 DIAGNOSIS — D259 Leiomyoma of uterus, unspecified: Secondary | ICD-10-CM

## 2019-11-25 HISTORY — PX: ROBOTIC ASSISTED TOTAL HYSTERECTOMY WITH BILATERAL SALPINGO OOPHERECTOMY: SHX6086

## 2019-11-25 LAB — TYPE AND SCREEN
ABO/RH(D): O POS
Antibody Screen: NEGATIVE

## 2019-11-25 LAB — CBC
HCT: 39.6 % (ref 36.0–46.0)
Hemoglobin: 13.2 g/dL (ref 12.0–15.0)
MCH: 30.2 pg (ref 26.0–34.0)
MCHC: 33.3 g/dL (ref 30.0–36.0)
MCV: 90.6 fL (ref 80.0–100.0)
Platelets: 276 10*3/uL (ref 150–400)
RBC: 4.37 MIL/uL (ref 3.87–5.11)
RDW: 12.5 % (ref 11.5–15.5)
WBC: 13.3 10*3/uL — ABNORMAL HIGH (ref 4.0–10.5)
nRBC: 0 % (ref 0.0–0.2)

## 2019-11-25 LAB — POCT PREGNANCY, URINE: Preg Test, Ur: NEGATIVE

## 2019-11-25 SURGERY — HYSTERECTOMY, TOTAL, ROBOT-ASSISTED, LAPAROSCOPIC, WITH BILATERAL SALPINGO-OOPHORECTOMY
Anesthesia: General | Site: Abdomen | Laterality: Bilateral

## 2019-11-25 MED ORDER — ONDANSETRON HCL 4 MG/2ML IJ SOLN
4.0000 mg | Freq: Four times a day (QID) | INTRAMUSCULAR | Status: DC | PRN
  Filled 2019-11-25: qty 2

## 2019-11-25 MED ORDER — DEXAMETHASONE SODIUM PHOSPHATE 10 MG/ML IJ SOLN
INTRAMUSCULAR | Status: AC
  Filled 2019-11-25: qty 1

## 2019-11-25 MED ORDER — HYDROMORPHONE HCL 2 MG PO TABS
1.0000 mg | ORAL_TABLET | ORAL | Status: DC | PRN
  Filled 2019-11-25: qty 1

## 2019-11-25 MED ORDER — ONDANSETRON HCL 4 MG/2ML IJ SOLN
INTRAMUSCULAR | Status: AC
  Filled 2019-11-25: qty 2

## 2019-11-25 MED ORDER — PROPOFOL 10 MG/ML IV BOLUS
INTRAVENOUS | Status: DC | PRN
  Administered 2019-11-25: 20 mg via INTRAVENOUS
  Administered 2019-11-25: 150 mg via INTRAVENOUS
  Administered 2019-11-25: 30 mg via INTRAVENOUS

## 2019-11-25 MED ORDER — LACTATED RINGERS IV SOLN
INTRAVENOUS | Status: DC
  Filled 2019-11-25: qty 1000

## 2019-11-25 MED ORDER — ROCURONIUM BROMIDE 50 MG/5ML IV SOSY
PREFILLED_SYRINGE | INTRAVENOUS | Status: DC | PRN
  Administered 2019-11-25: 10 mg via INTRAVENOUS
  Administered 2019-11-25 (×2): 20 mg via INTRAVENOUS
  Administered 2019-11-25: 50 mg via INTRAVENOUS

## 2019-11-25 MED ORDER — DEXAMETHASONE SODIUM PHOSPHATE 10 MG/ML IJ SOLN
INTRAMUSCULAR | Status: DC | PRN
  Administered 2019-11-25: 10 mg via INTRAVENOUS

## 2019-11-25 MED ORDER — FENTANYL CITRATE (PF) 100 MCG/2ML IJ SOLN
INTRAMUSCULAR | Status: DC | PRN
  Administered 2019-11-25 (×5): 50 ug via INTRAVENOUS

## 2019-11-25 MED ORDER — LABETALOL HCL 5 MG/ML IV SOLN
INTRAVENOUS | Status: DC | PRN
  Administered 2019-11-25: 5 mg via INTRAVENOUS

## 2019-11-25 MED ORDER — HYDROMORPHONE HCL 1 MG/ML IJ SOLN
INTRAMUSCULAR | Status: AC
  Filled 2019-11-25: qty 1

## 2019-11-25 MED ORDER — CEFAZOLIN SODIUM-DEXTROSE 2-4 GM/100ML-% IV SOLN
2.0000 g | INTRAVENOUS | Status: AC
  Administered 2019-11-25: 2 g via INTRAVENOUS
  Filled 2019-11-25: qty 100

## 2019-11-25 MED ORDER — PROPOFOL 10 MG/ML IV BOLUS
INTRAVENOUS | Status: AC
  Filled 2019-11-25: qty 40

## 2019-11-25 MED ORDER — ROCURONIUM BROMIDE 10 MG/ML (PF) SYRINGE
PREFILLED_SYRINGE | INTRAVENOUS | Status: AC
  Filled 2019-11-25: qty 10

## 2019-11-25 MED ORDER — ACETAMINOPHEN 10 MG/ML IV SOLN
INTRAVENOUS | Status: DC | PRN
  Administered 2019-11-25: 1000 mg via INTRAVENOUS

## 2019-11-25 MED ORDER — KETOROLAC TROMETHAMINE 30 MG/ML IJ SOLN
INTRAMUSCULAR | Status: DC | PRN
  Administered 2019-11-25: 30 mg via INTRAVENOUS

## 2019-11-25 MED ORDER — ACETAMINOPHEN 325 MG PO TABS
650.0000 mg | ORAL_TABLET | ORAL | Status: DC | PRN
  Filled 2019-11-25: qty 2

## 2019-11-25 MED ORDER — HYDROCODONE-ACETAMINOPHEN 5-325 MG PO TABS
ORAL_TABLET | ORAL | Status: AC
  Filled 2019-11-25: qty 1

## 2019-11-25 MED ORDER — KETOROLAC TROMETHAMINE 30 MG/ML IJ SOLN
INTRAMUSCULAR | Status: AC
  Filled 2019-11-25: qty 1

## 2019-11-25 MED ORDER — HYDROMORPHONE HCL 1 MG/ML IJ SOLN
0.2500 mg | INTRAMUSCULAR | Status: DC | PRN
  Administered 2019-11-25 (×2): 0.5 mg via INTRAVENOUS
  Filled 2019-11-25: qty 0.5

## 2019-11-25 MED ORDER — LIDOCAINE 2% (20 MG/ML) 5 ML SYRINGE
INTRAMUSCULAR | Status: AC
  Filled 2019-11-25: qty 5

## 2019-11-25 MED ORDER — OXYCODONE HCL 5 MG PO TABS
5.0000 mg | ORAL_TABLET | Freq: Once | ORAL | Status: DC | PRN
  Filled 2019-11-25: qty 1

## 2019-11-25 MED ORDER — MIDAZOLAM HCL 2 MG/2ML IJ SOLN
INTRAMUSCULAR | Status: AC
  Filled 2019-11-25: qty 2

## 2019-11-25 MED ORDER — OXYCODONE HCL 5 MG/5ML PO SOLN
5.0000 mg | Freq: Once | ORAL | Status: DC | PRN
  Filled 2019-11-25: qty 5

## 2019-11-25 MED ORDER — BUPIVACAINE HCL (PF) 0.25 % IJ SOLN
INTRAMUSCULAR | Status: DC | PRN
  Administered 2019-11-25: 14 mL

## 2019-11-25 MED ORDER — ARTIFICIAL TEARS OPHTHALMIC OINT
TOPICAL_OINTMENT | OPHTHALMIC | Status: AC
  Filled 2019-11-25: qty 3.5

## 2019-11-25 MED ORDER — LABETALOL HCL 5 MG/ML IV SOLN
INTRAVENOUS | Status: AC
  Filled 2019-11-25: qty 4

## 2019-11-25 MED ORDER — CEFAZOLIN SODIUM-DEXTROSE 2-4 GM/100ML-% IV SOLN
INTRAVENOUS | Status: AC
  Filled 2019-11-25: qty 100

## 2019-11-25 MED ORDER — HYDROCODONE-ACETAMINOPHEN 5-325 MG PO TABS
1.0000 | ORAL_TABLET | ORAL | Status: DC | PRN
  Administered 2019-11-25 (×2): 1 via ORAL
  Filled 2019-11-25: qty 2

## 2019-11-25 MED ORDER — SUGAMMADEX SODIUM 200 MG/2ML IV SOLN
INTRAVENOUS | Status: DC | PRN
  Administered 2019-11-25: 150 mg via INTRAVENOUS

## 2019-11-25 MED ORDER — MIDAZOLAM HCL 5 MG/5ML IJ SOLN
INTRAMUSCULAR | Status: DC | PRN
  Administered 2019-11-25: 2 mg via INTRAVENOUS

## 2019-11-25 MED ORDER — OXYCODONE-ACETAMINOPHEN 7.5-325 MG PO TABS: 1.0000 | ORAL_TABLET | Freq: Four times a day (QID) | ORAL | 0 refills | Status: DC | PRN

## 2019-11-25 MED ORDER — ONDANSETRON HCL 4 MG/2ML IJ SOLN
INTRAMUSCULAR | Status: DC | PRN
  Administered 2019-11-25: 4 mg via INTRAVENOUS

## 2019-11-25 MED ORDER — FENTANYL CITRATE (PF) 250 MCG/5ML IJ SOLN
INTRAMUSCULAR | Status: AC
  Filled 2019-11-25: qty 5

## 2019-11-25 MED ORDER — SODIUM CHLORIDE 0.9 % IR SOLN
Status: DC | PRN
  Administered 2019-11-25: 3000 mL

## 2019-11-25 MED ORDER — LIDOCAINE 2% (20 MG/ML) 5 ML SYRINGE
INTRAMUSCULAR | Status: DC | PRN
  Administered 2019-11-25: 40 mg via INTRAVENOUS

## 2019-11-25 SURGICAL SUPPLY — 52 items
CATH FOLEY 3WAY  5CC 16FR (CATHETERS) ×2
CATH FOLEY 3WAY 5CC 16FR (CATHETERS) ×1 IMPLANT
COVER BACK TABLE 60X90IN (DRAPES) ×3 IMPLANT
COVER TIP SHEARS 8 DVNC (MISCELLANEOUS) ×1 IMPLANT
COVER TIP SHEARS 8MM DA VINCI (MISCELLANEOUS) ×2
DEFOGGER SCOPE WARMER CLEARIFY (MISCELLANEOUS) ×3 IMPLANT
DERMABOND ADVANCED (GAUZE/BANDAGES/DRESSINGS) ×2
DERMABOND ADVANCED .7 DNX12 (GAUZE/BANDAGES/DRESSINGS) ×1 IMPLANT
DRAPE ARM DVNC X/XI (DISPOSABLE) ×4 IMPLANT
DRAPE COLUMN DVNC XI (DISPOSABLE) ×1 IMPLANT
DRAPE DA VINCI XI ARM (DISPOSABLE) ×8
DRAPE DA VINCI XI COLUMN (DISPOSABLE) ×2
DURAPREP 26ML APPLICATOR (WOUND CARE) ×3 IMPLANT
ELECT REM PT RETURN 9FT ADLT (ELECTROSURGICAL) ×3
ELECTRODE REM PT RTRN 9FT ADLT (ELECTROSURGICAL) ×1 IMPLANT
GAUZE PETROLATUM 1 X8 (GAUZE/BANDAGES/DRESSINGS) ×3 IMPLANT
GLOVE BIO SURGEON STRL SZ 6.5 (GLOVE) ×6 IMPLANT
GLOVE BIO SURGEON STRL SZ7 (GLOVE) ×6 IMPLANT
GLOVE BIO SURGEON STRL SZ8 (GLOVE) ×6 IMPLANT
GLOVE BIO SURGEONS STRL SZ 6.5 (GLOVE) ×3
GLOVE BIOGEL PI IND STRL 7.0 (GLOVE) ×7 IMPLANT
GLOVE BIOGEL PI IND STRL 7.5 (GLOVE) ×2 IMPLANT
GLOVE BIOGEL PI IND STRL 8 (GLOVE) ×1 IMPLANT
GLOVE BIOGEL PI INDICATOR 7.0 (GLOVE) ×14
GLOVE BIOGEL PI INDICATOR 7.5 (GLOVE) ×4
GLOVE BIOGEL PI INDICATOR 8 (GLOVE) ×2
HOLDER FOLEY CATH W/STRAP (MISCELLANEOUS) ×3 IMPLANT
IRRIG SUCT STRYKERFLOW 2 WTIP (MISCELLANEOUS) ×3
IRRIGATION SUCT STRKRFLW 2 WTP (MISCELLANEOUS) ×1 IMPLANT
LEGGING LITHOTOMY PAIR STRL (DRAPES) ×3 IMPLANT
MANIFOLD NEPTUNE II (INSTRUMENTS) ×3 IMPLANT
OBTURATOR OPTICAL STANDARD 8MM (TROCAR) ×2
OBTURATOR OPTICAL STND 8 DVNC (TROCAR) ×1
OBTURATOR OPTICALSTD 8 DVNC (TROCAR) ×1 IMPLANT
OCCLUDER COLPOPNEUMO (BALLOONS) ×3 IMPLANT
PACK ROBOT WH (CUSTOM PROCEDURE TRAY) ×3 IMPLANT
PACK ROBOTIC GOWN (GOWN DISPOSABLE) ×3 IMPLANT
PACK TRENDGUARD 450 HYBRID PRO (MISCELLANEOUS) ×1 IMPLANT
PAD PREP 24X48 CUFFED NSTRL (MISCELLANEOUS) ×3 IMPLANT
PROTECTOR NERVE ULNAR (MISCELLANEOUS) ×6 IMPLANT
SEAL CANN UNIV 5-8 DVNC XI (MISCELLANEOUS) ×4 IMPLANT
SEAL XI 5MM-8MM UNIVERSAL (MISCELLANEOUS) ×8
SET TRI-LUMEN FLTR TB AIRSEAL (TUBING) ×3 IMPLANT
SUT VIC AB 4-0 PS2 27 (SUTURE) ×9 IMPLANT
SUT VICRYL 0 UR6 27IN ABS (SUTURE) ×3 IMPLANT
SUT VLOC 180 0 9IN  GS21 (SUTURE) ×2
SUT VLOC 180 0 9IN GS21 (SUTURE) ×1 IMPLANT
TIP UTERINE 6.7X10CM GRN DISP (MISCELLANEOUS) ×3 IMPLANT
TOWEL OR 17X26 10 PK STRL BLUE (TOWEL DISPOSABLE) ×3 IMPLANT
TRENDGUARD 450 HYBRID PRO PACK (MISCELLANEOUS) ×3
TROCAR PORT AIRSEAL 5X120 (TROCAR) ×3 IMPLANT
WATER STERILE IRR 500ML POUR (IV SOLUTION) ×3 IMPLANT

## 2019-11-25 NOTE — Progress Notes (Signed)
Call made to Dr. Dellis Filbert for patient discharge. Patient has voided, ambulated in hall, tolerated PO diet and pain is controlled with oral analgesics. Dr. Dellis Filbert informed staff she would not be able to complete discharge in chart until later in the evening but permission given to place verbal order to D/C patient home. Dr. Dellis Filbert also informed staff she will send in prescriptions for analgesics to pharmacy on chart by 1930 this p.m. MD also stated no additional D/C instructions needed other than post total hysterectomy instructions. Staff verified with patient correct pharmacy in chart. Orders enacted, D/C instructions provided and explained to patient and daughter.

## 2019-11-25 NOTE — Anesthesia Preprocedure Evaluation (Signed)
Anesthesia Evaluation  Patient identified by MRN, date of birth, ID band Patient awake    Reviewed: Allergy & Precautions, H&P , NPO status , Patient's Chart, lab work & pertinent test results  Airway Mallampati: II   Neck ROM: full    Dental   Pulmonary former smoker,    breath sounds clear to auscultation       Cardiovascular negative cardio ROS   Rhythm:regular Rate:Normal     Neuro/Psych H/o Bell's palsy - resolved    GI/Hepatic   Endo/Other    Renal/GU      Musculoskeletal   Abdominal   Peds  Hematology   Anesthesia Other Findings   Reproductive/Obstetrics                             Anesthesia Physical Anesthesia Plan  ASA: II  Anesthesia Plan: General   Post-op Pain Management:    Induction: Intravenous  PONV Risk Score and Plan: 3 and Ondansetron, Dexamethasone, Midazolam and Treatment may vary due to age or medical condition  Airway Management Planned: Oral ETT  Additional Equipment:   Intra-op Plan:   Post-operative Plan: Extubation in OR  Informed Consent: I have reviewed the patients History and Physical, chart, labs and discussed the procedure including the risks, benefits and alternatives for the proposed anesthesia with the patient or authorized representative who has indicated his/her understanding and acceptance.       Plan Discussed with: CRNA, Anesthesiologist and Surgeon  Anesthesia Plan Comments:         Anesthesia Quick Evaluation

## 2019-11-25 NOTE — Telephone Encounter (Signed)
Pt underwent robotic hysterectomy today.  She called stating rx for post op pain medication was not sent to her pharmacy.  Rx was sent to [facility] in [place].  [facility].  The [facility] pharmacy is already closed.  Advised I cannot sent this into another pharmacy for her.  We discussed using heat and ibuprofen with Tylenol, dosage and intervals.  She will plan to get rx in AM and hopefully will not need anything else tonight.  Appreciative of phone call.

## 2019-11-25 NOTE — Transfer of Care (Signed)
Immediate Anesthesia Transfer of Care Note  Patient: [name]  Procedure(s) Performed: XI ROBOTIC ASSISTED TOTAL HYSTERECTOMY WITH BILATERAL SALPINGECTOMY (Bilateral Abdomen)  Patient Location: PACU  Anesthesia Type:General  Level of Consciousness: drowsy  Airway & Oxygen Therapy: Patient Spontanous Breathing and Patient connected to nasal cannula oxygen  Post-op Assessment: Report given to RN  Post vital signs: Reviewed and stable  Last Vitals:  Vitals Value Taken Time  BP 132/76 11/25/19 1107  Temp 36.6 C 11/25/19 1106  Pulse 76 11/25/19 1109  Resp 15 11/25/19 1109  SpO2 100 % 11/25/19 1109  Vitals shown include unvalidated device data.  Last Pain:  Vitals:   11/25/19 0710  TempSrc: Oral  PainSc: 0-No pain      Patients Stated Pain Goal: 7 ([id number])  Complications: No apparent anesthesia complications

## 2019-11-25 NOTE — Anesthesia Procedure Notes (Signed)
Procedure Name: Intubation Date/Time: 11/25/2019 8:18 AM Performed by: Bonney Aid, CRNA Pre-anesthesia Checklist: Patient identified, Emergency Drugs available, Suction available and Patient being monitored Patient Re-evaluated:Patient Re-evaluated prior to induction Oxygen Delivery Method: Circle system utilized Preoxygenation: Pre-oxygenation with 100% oxygen Induction Type: IV induction Ventilation: Mask ventilation without difficulty Laryngoscope Size: Mac and 3 Grade View: Grade I Tube type: Oral Tube size: 7.0 mm Number of attempts: 1 Airway Equipment and Method: Stylet Placement Confirmation: ETT inserted through vocal cords under direct vision,  positive ETCO2 and breath sounds checked- equal and bilateral Secured at: 20 cm Tube secured with: Tape Dental Injury: Teeth and Oropharynx as per pre-operative assessment

## 2019-11-25 NOTE — H&P (Signed)
Megan Kelley is an 49 y.o. female  G1P1L1    RP: Fibroids with persistent Menometrorrhagia for XI Robotic TLH/Bilateral Salpingectomy  HPI: Heavy menstrual periods with secondary anemia. Received IV Iron/currently on Iron tablets. Pelvic US 04/07/2019 showed Fibroids, including probable SM Fibroid and Thickened Endometrial line. Hb at 9.7 on 03/26/2019. Post HSC/Myosure Excision/D+C on 08/12/2019 with continued heavy frequent vaginal bleeding and pelvic cramping.  Pertinent Gynecological History: Menses: Persistent menometro Contraception: abstinence Blood transfusions: none Sexually transmitted diseases: no past history Previous GYN Procedures: [place] Excision of Fibroid/D+C 08/2019  Last mammogram: normal  Last pap: normal  OB History: G1, P1   Menstrual History: Patient's last menstrual period was 10/25/2019.    Past Medical History:  Diagnosis Date  . Eczema   . Endometrial polyp   . History of Bell's palsy    2012 left side ,  resolved  . History of hypertension    08-04-2019  per pt over 10 yrs took medication briefly , lost wt and watch diet , no more medication since  . 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 IDA (iron deficiency anemia)     Past Surgical History:  Procedure Laterality Date  . DILATATION & CURETTAGE/HYSTEROSCOPY WITH MYOSURE N/A 08/12/2019   Procedure: [place];  Surgeon: Princess Bruins, MD;  Location: [facility];  Service: Gynecology;  Laterality: N/A;  Request 7:30am OR time in [place] Gyn block requests 45 minutes  . ESOPHAGOGASTRODUODENOSCOPY  2012 approx.    Family History  Problem Relation Age of Onset  . Pancreatic cancer Father   . Cancer Father   . Breast cancer Maternal Grandmother 69  . Cancer Maternal Grandmother   . Diabetes Mother        borderline  . Alcohol abuse Mother   . Arthritis Mother   . Depression Mother   .  Hypertension Mother   . Learning disabilities Mother   . Alcohol abuse Maternal Grandfather   . Eczema Brother   . Hepatitis C Brother   . Other Brother        suicide  . Depression Brother   . Drug abuse Brother   . Early death Brother   . Learning disabilities Brother     Social History:  reports that she quit smoking about 5 years ago. Her smoking use included cigarettes. She has a 2.00 pack-year smoking history. She has never used smokeless tobacco. She reports current alcohol use of about 6.0 standard drinks of alcohol per week. She reports previous drug use.  Allergies: No Known Allergies  Medications Prior to Admission  Medication Sig Dispense Refill Last Dose  . FEROSUL 325 (65 Fe) MG tablet TAKE 1 TABLET BY MOUTH EVERY DAY (Patient taking differently: Take 325 mg by mouth daily with breakfast. ) 90 tablet 0 11/24/2019 at Unknown time  . fluticasone (CUTIVATE) 0.05 % cream Apply 1 application topically daily as needed (eczema).   Past Week at Unknown time  . ibuprofen (ADVIL) 200 MG tablet Take 400 mg by mouth every 6 (six) hours as needed for moderate pain.    Past Month at Unknown time    REVIEW OF SYSTEMS: A ROS was performed and pertinent positives and negatives are included in the history.  GENERAL: No fevers or chills. HEENT: No change in vision, no earache, sore throat or sinus congestion. NECK: No pain or stiffness. CARDIOVASCULAR: No chest pain or pressure. No  palpitations. PULMONARY: No shortness of breath, cough or wheeze. GASTROINTESTINAL: No abdominal pain, nausea, vomiting or diarrhea, melena or bright red blood per rectum. GENITOURINARY: No urinary frequency, urgency, hesitancy or dysuria. MUSCULOSKELETAL: No joint or muscle pain, no back pain, no recent trauma. DERMATOLOGIC: No rash, no itching, no lesions. ENDOCRINE: No polyuria, polydipsia, no heat or cold intolerance. No recent change in weight. HEMATOLOGICAL: No anemia or easy bruising or bleeding. NEUROLOGIC: No  headache, seizures, numbness, tingling or weakness. PSYCHIATRIC: No depression, no loss of interest in normal activity or change in sleep pattern.     Blood pressure 135/82, pulse 67, temperature 98.2 F (36.8 C), temperature source Oral, resp. rate 16, height 5\' 4"  (1.626 m), weight 73.5 kg, last menstrual period 10/25/2019, SpO2 100 %.  Physical Exam:  See office notes   Results for orders placed or performed during the hospital encounter of 11/25/19 (from the past 24 hour(s))  Pregnancy, urine POC     Status: None   Collection Time: 11/25/19  6:48 AM  Result Value Ref Range   Preg Test, Ur NEGATIVE NEGATIVE   Covid Negative  Hb 14 on 11/19/2019  Patho1/12/2019:FINAL MICROSCOPIC DIAGNOSIS: ENDOMETRIUM, CURETTAGE:  - Proliferative endometrium.  - Smooth muscle fragments consistent with submucosal leiomyoma.  - No hyperplasia or malignancy.   T/V and T/Aimages 05/2019: Comparison made with the outsidepelvic ultrasoundfromAugust 31, 2020. Enlarged anteverted uterus with several intramural and subserosal fibroids. The overall uterine size is measured at 13.17 x 7.55 x 6.17 cm. 7 fibroids were measured the largest of which was 3.63 cm x 2.87 cm. A3.5 cm is pedunculated into the right adnexa and a 0.9 cm fibroid is adjacent to the endometrial cavity, possibly partly submucosal. The endometrial lining is thinner than on the previous scan at 6.55millimeter today. A focal 1 cm echogenic mass is visualized in the lower uterine segment with a feeder vessel posteriorly. Both ovaries are small with sparse follicles. No free fluid in the posterior to the sac.   Assessment/Plan:48 y.o.G1P1   1. Fibroids Uterine fibroids with menometrorrhagia and secondary anemia not improved after hysteroscopy with excision of a submucosal myoma. Pathology benign. Declines attempting hormonal control as it did not work in the past. Depo-Provera and progesterone IUD reviewed. Endometrial  ablation discussed. Decision to proceed with a hysterectomy. Will use the robotic approach,XIrobotic total laparoscopic hysterectomy with bilateral salpingectomy scheduled.  Preservation of the ovaries recommended. Patient voiced understanding and agreement with plan.   2. Menometrorrhagia As above.  3. Secondary anemia Last hemoglobin January 2021 was at 10.7. Recommend continuing on megestrol and iron supplement until surgery.  Hb 11/19/2019 at 14.                        Patient was counseled as to the risk of surgery to include the following:  1. Infection (prohylactic antibiotics will be administered)  2. DVT/Pulmonary Embolism (prophylactic pneumo compression stockings will be used)  3.Trauma to internal organs requiring additional surgical procedure to repair any injury to internal organs requiring perhaps additional hospitalization days.  4.Hemmorhage requiring transfusion and blood products which carry risks such as anaphylactic reaction, hepatitis and AIDS  Patient had received literature information on the procedure scheduled and all her questions were answered and fully accepts all risk.  Marie-Lyne Shakinah Navis 11/25/2019, 7:50 AM

## 2019-11-25 NOTE — Op Note (Signed)
Operative Note  11/25/2019  11:09 AM  PATIENT:  Megan Kelley  49 y.o. female  PRE-OPERATIVE DIAGNOSIS:  Fibroids, menometrorrhagia, anemia  POST-OPERATIVE DIAGNOSIS:  Fibroids, menometrorrhagia, anemia, probable adenomyosis  PROCEDURE:  Procedure(s): XI ROBOTIC ASSISTED TOTAL HYSTERECTOMY WITH BILATERAL SALPINGECTOMY  SURGEON:  Surgeon(s): Princess Bruins, MD Joseph Pierini, MD  ANESTHESIA:   general  FINDINGS: Uterus with many fibroids and probable adenomyosis, bilateral tubes normal.  Ovaries normal.  DESCRIPTION OF OPERATION: Under general anesthesia with endotracheal intubation the patient is in lithotomy position.  She is prepped with DuraPrep on the abdomen and with Betadine on the suprapubic, vulvar and vaginal areas.  The vaginal exam reveals an anteverted uterus, increased in size to about 1314 cm and nodular, mobile.  No adnexal mass felt.  The weighted speculum is inserted in the vagina and the anterior lip of the cervix is grasped with a tenaculum.  The hysterometry is at 13 cm.  Dilation with Pratt dilators up to #21.  The #10 Rumi with a medium Koh ring are put in place and the other instruments are removed.  The Foley is inserted in the bladder.  We go to the abdomen.  We infiltrated the supraumbilical area with Marcaine one quarter plain 5 cc.  We make a 1.5 cm incision at that level with a scalpel.  The aponeurosis is grasped with cokers and the aponeurosis is open with Mayo scissors.  The parietal peritoneum is opened with Mayo scissors after grasping with hemostats.  We put a pursestring stitch of Vicryl 0 at the aponeurosis.  The Sheryle Hail is inserted under direct vision and up pneumoperitoneum is created with CO2.  All port sites are free.  We take pictures of the liver.  In the pelvis we note a very enlarged uterus with fibroids, both ovaries are normal in size and appearance with a small functional cyst on the right ovary.  Both tubes are normal.  We marked the  skin with a pen at the port sites.  We infiltrate with Marcaine one quarter plain at each site.  We make small incisions with the scalp L.  2 robotic ports are inserted under direct vision on the right side at the level of the umbilicus and 1 robotic port is inserted on the left distally with the 5 mm assistant port on the left medially.  The patient is positioned in 30 degree Trendelenburg.  The robot is docked.  Targeting is done.  All robotic instruments are inserted under direct vision with the fenestrated clamp in the fourth arm, the scissors in the third arm, the bipolar long clamp in the first arm.  We go to the console.      Both ureters are seen in normal anatomic position with good peristalsis.  We start on the right side with cauterization and section of the right mesosalpinx we then cauterized and sectioned the right utero-ovarian ligament and to cauterize and section the right round ligament.  We then descend along the right broad ligament.  A fibroid is present at the lower right broad ligament both attached to the uterus and the pelvic wall.  We switched the fenestrated clamp to a tenaculum in the fourth arm to expose better.  We cauterized and section between the uterus and the fibroid.  We then opened the visceral peritoneum anteriorly and started distending the bladder.  We then cauterized and section the right uterine artery.  On the left side we cauterized and sectioned the left mesosalpinx, we cauterized and  section the left utero-ovarian ligament, we cauterized and section the left round ligament.  We opened the visceral peritoneum and further descend the bladder past the [employer] ring.  We then cauterized and section the left uterine artery.  The vaginal occluder is inflated in the vagina.  The vagina is opened superiorly with the tip of the scissors on the Koh ring first anteriorly, then on either side and we finished posteriorly.  The uterus is completely detached with both tubes and cervix.  We  then resected the right side to make the uterus smaller to go through vaginally and that the pieces removed after.  The occluder is put back vaginally.  We detach the fibroid that was attached to the right wall of the pelvis and passed vaginally as well.  The occluder is put back in place in the vagina.  We then switched instruments to close the vaginal vault.  The cutting needle driver is inserted in the third arm and the long tip in the fourth first arm.  We used a V-Loc 0 at 9 inches to close the vaginal vault.  We started the right angle with a continuous stitch to the left angle and then back to the middle.  The needle is removed.  Hemostasis is verified and is adequate at all levels.  Irrigation and suction was done.  Pictures were taken before and after the procedure.  We removed the robotic instruments under direct vision.  We undocked the robot.  The ports are removed.  The CO2 is evacuated.  The pursestring stitch is attached to close the aponeurosis.  We then closed the skin at all incisions with Vicryl 4-0 in subcu cuticular stitches.  We added Dermabond on all incisions.  Hemostasis is adequate at all levels.  The patient is brought to recovery room in good and stable status.  ESTIMATED BLOOD LOSS: 20 mL   Intake/Output Summary (Last 24 hours) at 11/25/2019 1109 Last data filed at 11/25/2019 1039 Gross per 24 hour  Intake 200 ml  Output 220 ml  Net -20 ml     BLOOD ADMINISTERED:none   LOCAL MEDICATIONS USED:  MARCAINE     SPECIMEN:  Source of Specimen:  Uterus with cervix, bilateral tubes and fibroids,   DISPOSITION OF SPECIMEN:  PATHOLOGY  COUNTS:  YES  PLAN OF CARE: Transfer to PACU  Marie-Lyne LavoieMD11:09 AM

## 2019-11-25 NOTE — Discharge Instructions (Signed)

## 2019-11-26 LAB — SURGICAL PATHOLOGY

## 2019-11-26 NOTE — Anesthesia Postprocedure Evaluation (Signed)
Anesthesia Post Note  Patient: Megan Kelley  Procedure(s) Performed: XI ROBOTIC ASSISTED TOTAL HYSTERECTOMY WITH BILATERAL SALPINGECTOMY (Bilateral Abdomen)     Patient location during evaluation: PACU Anesthesia Type: General Level of consciousness: awake and alert Pain management: pain level controlled Vital Signs Assessment: post-procedure vital signs reviewed and stable Respiratory status: spontaneous breathing, nonlabored ventilation, respiratory function stable and patient connected to nasal cannula oxygen Cardiovascular status: blood pressure returned to baseline and stable Postop Assessment: no apparent nausea or vomiting Anesthetic complications: no    Last Vitals:  Vitals:   11/25/19 1400 11/25/19 1654  BP: 128/74 (!) 148/81  Pulse: 86 91  Resp: 16 18  Temp: 37 C 36.9 C  SpO2: 98% 99%    Last Pain:  Vitals:   11/26/19 0938  TempSrc:   PainSc: 2                  Ebonee Stober S

## 2019-11-26 NOTE — Progress Notes (Signed)
PO 7 hours XI Robotic TLH/Bilateral Salpingectomy  Subjective: Patient reports tolerating PO and no problems voiding.    Objective: I have reviewed patient's vital signs.  vital signs, intake and output, medications and labs.  Vitals:   11/25/19 1400 11/25/19 1654  BP: 128/74 (!) 148/81  Pulse: 86 91  Resp: 16 18  Temp: 98.6 F (37 C) 98.5 F (36.9 C)  SpO2: 98% 99%   No intake/output data recorded. Total I/O In: 2045 [P.O.:720; I.V.:1125; IV Piggyback:200] Out: 445 [Urine:425; Blood:20]  Results for orders placed or performed during the hospital encounter of 11/25/19 (from the past 24 hour(s))  CBC     Status: Abnormal   Collection Time: 11/25/19  3:35 PM  Result Value Ref Range   WBC 13.3 (H) 4.0 - 10.5 K/uL   RBC 4.37 3.87 - 5.11 MIL/uL   Hemoglobin 13.2 12.0 - 15.0 g/dL   HCT 39.6 36.0 - 46.0 %   MCV 90.6 80.0 - 100.0 fL   MCH 30.2 26.0 - 34.0 pg   MCHC 33.3 30.0 - 36.0 g/dL   RDW 12.5 11.5 - 15.5 %   Platelets 276 150 - 400 K/uL   nRBC 0.0 0.0 - 0.2 %    EXAM General: alert and cooperative  Assessment: s/p Procedure(s): XI ROBOTIC ASSISTED TOTAL HYSTERECTOMY WITH BILATERAL SALPINGECTOMY: stable, progressing well and tolerating diet  Plan: Discontinue IV fluids Discharge home  LOS: 0 days    Princess Bruins, MD 11/26/2019 8:57 AM

## 2019-11-26 NOTE — Discharge Summary (Signed)
Physician Discharge Summary  Patient ID: Megan Kelley MRN: [id number] DOB/AGE: 15-Jul-1971 49 y.o.  Admit date: 11/25/2019 Discharge date: 11/26/2019  Admission Diagnoses: Postoperative state [Z98.890] Post-operative state [Z98.890]   Discharge Diagnoses: Menometrorrhagia/Pain/Fibroids Active Problems:   Postoperative state   Post-operative state   Discharged Condition: good  Consults:None  Significant Diagnostic Studies: labs: Hb 13.2 postop  Treatments:surgery: XI Robotic TLH/Bilateral Salpingectomy  Vitals:   11/25/19 1400 11/25/19 1654  BP: 128/74 (!) 148/81  Pulse: 86 91  Resp: 16 18  Temp: 98.6 F (37 C) 98.5 F (36.9 C)  SpO2: 98% 99%     Total I/O In: 2045 [P.O.:720; I.V.:1125; IV Piggyback:200] Out: 445 [Urine:425; Blood:20]   Hospital Course: Good  Discharge Exam: Normal postop by nurse  Disposition: Discharge disposition: 01-Home or Self Care          Allergies as of 11/25/2019   No Known Allergies     Medication List    ASK your doctor about these medications   FeroSul 325 (65 FE) MG tablet Generic drug: ferrous sulfate TAKE 1 TABLET BY MOUTH EVERY DAY   fluticasone 0.05 % cream Commonly known as: CUTIVATE Apply 1 application topically daily as needed (eczema).   ibuprofen 200 MG tablet Commonly known as: ADVIL Take 400 mg by mouth every 6 (six) hours as needed for moderate pain.      Percocet prescription sent to pharmacy.     SignedPrincess Bruins 11/26/2019, 8:55 AM

## 2019-12-02 ENCOUNTER — Telehealth: Payer: Self-pay

## 2019-12-02 NOTE — Telephone Encounter (Signed)
Spoke with patient and informed her. °

## 2019-12-02 NOTE — Telephone Encounter (Signed)
I called patient to read her the My Chart message about benign pathology from surgery.  She had a few questions.  She has a little puffiness above umbilicus.  No pain and she said it is not soft like fluid is in it. No redness, no draining from incisions and no fever.  I reassured her but she seemed to want me to check with you that the puffiness was okay?  Also, mentioned some back pain after she took the last oxycodone. She said that she assumed it was from lying on her back all night sleeping and not moving around like she normally does.  I told her it could be positional but we would be happy to check her urine and see her if she desires. She declined. No uti sx.

## 2019-12-03 NOTE — Telephone Encounter (Signed)
Yes, a little puffiness at the incision is not concerning given no sign/symptom of infection.

## 2019-12-03 NOTE — Telephone Encounter (Signed)
Spoke with patient and informed her. °

## 2019-12-11 ENCOUNTER — Other Ambulatory Visit: Payer: Self-pay

## 2019-12-12 ENCOUNTER — Encounter: Payer: Self-pay | Admitting: Obstetrics & Gynecology

## 2019-12-12 ENCOUNTER — Ambulatory Visit (INDEPENDENT_AMBULATORY_CARE_PROVIDER_SITE_OTHER): Payer: 59 | Admitting: Obstetrics & Gynecology

## 2019-12-12 VITALS — BP 136/88

## 2019-12-12 DIAGNOSIS — Z09 Encounter for follow-up examination after completed treatment for conditions other than malignant neoplasm: Secondary | ICD-10-CM

## 2019-12-12 NOTE — Progress Notes (Signed)
    Megan Kelley August 17, 1970 [id number]        49 y.o.  G1P1L1  RP: Postop 3 weeks XI Robotic TLH/Bilateral Salpingectomy  HPI: Good healing postop.  Incisions well closed with no redness and no pain.  No abdominal pelvic pain.  No vaginal bleeding and no abnormal discharge.  No fever.   OB History  Gravida Para Term Preterm AB Living  1 1       1   SAB TAB Ectopic Multiple Live Births               # Outcome Date GA Lbr Len/2nd Weight Sex Delivery Anes PTL Lv  1 Para             Past medical history,surgical history, problem list, medications, allergies, family history and social history were all reviewed and documented in the EPIC chart.   Directed ROS with pertinent positives and negatives documented in the history of present illness/assessment and plan.  Exam:  Vitals:   12/12/19 1017  BP: 136/88   General appearance:  Normal  Abdomen: Incisions healing well, closed, no erythema, no induration.  Gynecologic exam: Vulva normal.  Speculum: Vaginal vault well closed.  No bleeding.  Normal secretions.     Assessment/Plan:  48 y.o. G1P1   1. Status post gynecological surgery, follow-up exam Good postop healing without complication.  Precautions reviewed.  Follow-up 4 weeks for vaginal vault check.  Princess Bruins MD, 10:34 AM 12/12/2019

## 2019-12-17 ENCOUNTER — Encounter: Payer: Self-pay | Admitting: Obstetrics & Gynecology

## 2019-12-17 NOTE — Patient Instructions (Signed)
1. Status post gynecological surgery, follow-up exam Good postop healing without complication.  Precautions reviewed.  Follow-up 4 weeks for vaginal vault check.  Megan Kelley, it was a pleasure seeing you today!

## 2020-01-07 ENCOUNTER — Other Ambulatory Visit: Payer: Self-pay | Admitting: Family Medicine

## 2020-01-07 ENCOUNTER — Other Ambulatory Visit: Payer: Self-pay

## 2020-01-08 ENCOUNTER — Ambulatory Visit: Payer: 59 | Admitting: Obstetrics & Gynecology

## 2020-01-14 ENCOUNTER — Other Ambulatory Visit: Payer: Self-pay

## 2020-01-15 ENCOUNTER — Telehealth: Payer: Self-pay | Admitting: *Deleted

## 2020-01-15 ENCOUNTER — Ambulatory Visit: Payer: 59 | Admitting: Obstetrics & Gynecology

## 2020-01-15 NOTE — Telephone Encounter (Signed)
Patient called and left message in triage requesting a call back regarding post-op. I called patient and received her voicemail. I asked her to call me back and if she gets my voicemail to leave me a detailed message.

## 2020-02-03 ENCOUNTER — Other Ambulatory Visit: Payer: Self-pay

## 2020-02-04 ENCOUNTER — Ambulatory Visit (INDEPENDENT_AMBULATORY_CARE_PROVIDER_SITE_OTHER): Payer: 59 | Admitting: Obstetrics & Gynecology

## 2020-02-04 ENCOUNTER — Encounter: Payer: Self-pay | Admitting: Obstetrics & Gynecology

## 2020-02-04 VITALS — BP 124/80

## 2020-02-04 DIAGNOSIS — Z09 Encounter for follow-up examination after completed treatment for conditions other than malignant neoplasm: Secondary | ICD-10-CM

## 2020-02-04 NOTE — Progress Notes (Signed)
    Megan Kelley 09-26-1970 [id number]        49 y.o.  G1P1L1  RP: Postop XI Robotic TLH/Bilateral Salpingectomy 11/25/2019  HPI: Very good postop evolution without complication.  Patient has resumed work.  No abdominal pelvic pain.  Incisions well-healed on the abdomen.  No vaginal bleeding.  No abnormal vaginal discharge.  No fever.  Urine and bowel movements normal.   OB History  Gravida Para Term Preterm AB Living  1 1       1   SAB TAB Ectopic Multiple Live Births               # Outcome Date GA Lbr Len/2nd Weight Sex Delivery Anes PTL Lv  1 Para             Past medical history,surgical history, problem list, medications, allergies, family history and social history were all reviewed and documented in the EPIC chart.   Directed ROS with pertinent positives and negatives documented in the history of present illness/assessment and plan.  Exam:  Vitals:   02/04/20 1624  BP: 124/80   General appearance:  Normal  Abdomen: Normal.  Incisions closed.  Gynecologic exam: Vulva normal.  Bimanual exam: Vaginal vault well-healed, closed with no induration.  No bleeding.  No abnormal discharge.  Nontender.  No pelvic mass felt.  Patho: FINAL MICROSCOPIC DIAGNOSIS:   A. UTERUS AND BILATERAL FALLOPIAN TUBES, HYSTERECTOMY WITH BILATERAL  SALPINGECTOMY:  Uterus:  - Secretory endometrium  - Leiomyomata (3.5 cm; largest)  - No malignancy identified   Cervix:  - Benign cervix  - No dysplasia or malignancy identified   Bilateral Fallopian tubes:  - Benign fallopian tubes  - No malignancy identified    Assessment/Plan:  49 y.o. G1P1   1. Status post gynecological surgery, follow-up exam Excellent postop evolution post XI robotic total laparoscopic hysterectomy with bilateral salpingectomy.  Can resume all physical activities and sexual activities with intercourse.  Follow-up annual exam when due.   Princess Bruins MD, 4:36 PM 02/04/2020

## 2020-02-07 ENCOUNTER — Encounter: Payer: Self-pay | Admitting: Obstetrics & Gynecology

## 2020-02-07 NOTE — Patient Instructions (Signed)
1. Status post gynecological surgery, follow-up exam Excellent postop evolution post XI robotic total laparoscopic hysterectomy with bilateral salpingectomy.  Can resume all physical activities and sexual activities with intercourse.  Follow-up annual exam when due.  Megan Kelley, it was a pleasure seeing you today!

## 2020-02-19 NOTE — Progress Notes (Signed)
Formatting of this note is different from the original.  History of Present Illness     Chief Complaint   Patient presents with   ? Skin Check     Pt presents for CSE. Pt states she has a spot on her right shoulder, left lower back, and a spot in her scalp her previously dermatologist was concerned about. She reports itchiness. Personal hx of MM on her right foot and no family hx of skin cancer. She occasionally wears SPF      Problem list, medication history, allergies, surgical history, tobacco history reviewed and updated as appropriate.  Review of Systems   Denies other eruptions, nonhealing lesions or skin complaints.   Other pertinent ROS per HPI.  Physical Examination   General: Well-developed, well-nourished, no apparent distress  Skin: A complete body skin exam was performed including the head, neck, chest, abdomen, back, arms, legs, and buttocks.  Pertinent exam findings are noted below in the assessment and plan below  Assessment/Plan     1. Idiopathic guttate hypomelanosis  Multiple, depigmented and hypopigmented 2-31m macules over the extremities   We discussed that Idiopathic guttate hypomelanosis (IGH) is an acquired, benign leukoderma of unknown etiology. Idiopathic guttate hypomelanosis is most commonly a complaint of middle-aged, light-skinned women, but it is increasingly seen in both sexes and older dark-skinned people with a history of long-term sun exposure.    2. History of Melanoma  Well healed surgical site(s); NED. MM on Right dorsal foot  We discussed the need for routine skin exams for patients with a history of skin cancer since the risk of recurrence of an excised lesion is about 5%, even when the pathology shows clear margins.  We also discussed that there is a 50% risk of developing a skin cancer at a new site and sun exposure needs to be minimal to minimize this risk.    3. Congenital nevus with minimal atypical features.   Patient notes history of having this since childhood, dark  brown macule left side.  Reassured of findings today; if it significantly changes in the future including change in color or size, I recommend that he return for reevaluation.    4. Eczematous Dermatitis  Pink, scaly eczematous thin papules and patches some with central excoriations primarily on hands. Gentle skin care regimen discussed including mild soaps and frequent moisturizing.     5. Lentigines  Tan brown macules scattered on sun exposed locations on trunk (shoulders, chest, back and arms)  Discussed secondary to chronic sun exposure over time  Sun Protection: I recommend avoiding sun during peak hours (10am- 2pm), sunscreen spf 30-50 applied to face, neck, and chest daily, as well as sun protective clothing and wide brim hats as appropriate.    6. Benign Nevi  358mwell demarcated brown macule right lower back. 37m49meticulated macule right posterior shoulder  Several well  demarcated flesh colored and brown macules and papules on trunk and extremities   Benign etiology discussed, reassurance    7. Cherry Angioma  -Scattered bright red macules and thin papules scattered on extremities  -Benign Reassurance    8. Seborrheic Keratoses   -Scattered stuck on brown papule(s) on extremities  -Benign reassurance     9. Macular Seborrheic Keratosis vs. Lentigo  [id number] thin patch on left dorsal middle finger  Benign etiology discussed, reassurance    10. Benign Lichenoid Keratosis vs Actinic Keratosis  Pink blanching minimally scaly patch on left calf  Benign etiology discussed; reassurance  Patient instructed to call if lesion increases in size or morphology in future    11. Seborrheic Dermatitis- minimal  Flesh colored-pink scaly greasy patches on scalp  Benign etiology and chronic nature discussed, reassurance, secondary to abnormal/allergic response to normal yeast growth on skin.    Follow up in about 6 months (around 08/21/2020) for CSE.    1. Idiopathic guttate hypomelanosis    2. History of melanoma    3.  Congenital nevus    4. Eczema, unspecified type    5. Lentigines    6. Multiple benign nevi    7. Cherry angioma    8. Seborrheic keratoses    9. Macular seborrheic keratosis    10. Lichenoid keratosis    11. Seborrheic dermatitis    12. Skin cancer screening      Scribed for Felipa Furnace, MD by Loretha Brasil, RMA 02/19/2020, 8:40 AM    Electronically signed by Felipa Furnace, MD at 02/19/2020  4:02 PM EDT

## 2020-06-15 ENCOUNTER — Encounter: Payer: Self-pay | Admitting: Obstetrics & Gynecology

## 2021-02-02 LAB — HM COLONOSCOPY

## 2021-07-22 ENCOUNTER — Encounter: Payer: Self-pay | Admitting: Family Medicine

## 2021-07-22 ENCOUNTER — Ambulatory Visit (INDEPENDENT_AMBULATORY_CARE_PROVIDER_SITE_OTHER): Payer: 59 | Admitting: Family Medicine

## 2021-07-22 VITALS — BP 150/82 | HR 57 | Temp 98.5°F | Ht 64.0 in | Wt 160.6 lb

## 2021-07-22 DIAGNOSIS — Z13220 Encounter for screening for lipoid disorders: Secondary | ICD-10-CM | POA: Diagnosis not present

## 2021-07-22 DIAGNOSIS — E611 Iron deficiency: Secondary | ICD-10-CM | POA: Diagnosis not present

## 2021-07-22 DIAGNOSIS — Z0000 Encounter for general adult medical examination without abnormal findings: Secondary | ICD-10-CM

## 2021-07-22 DIAGNOSIS — E559 Vitamin D deficiency, unspecified: Secondary | ICD-10-CM

## 2021-07-22 DIAGNOSIS — Z23 Encounter for immunization: Secondary | ICD-10-CM

## 2021-07-22 DIAGNOSIS — R5383 Other fatigue: Secondary | ICD-10-CM

## 2021-07-22 NOTE — Patient Instructions (Signed)
Set up follow up appointment with dermatology for routine skin check.

## 2021-07-22 NOTE — Progress Notes (Signed)
Lastacia Solum DOB: Dec 29, 1970 Encounter date: 07/22/2021  This is a 50 y.o. female who presents for complete physical   History of present illness/Additional concerns:   Sees Dr. Dellis Filbert for gyn care. Last visit 01/2020 for post op TLH/bilateral salpingectomy on 11/25/19. This went well for her.   Last visit with me was 03/26/19 for physical.   Started traveling for work; was on [employer] for most of the year. She wanted to touch base and recheck labwork, etc. Hx of iron deficiency. Working in [place] now. Had episode of diverticulitis and did have colonoscopy since last visit. Colonoscopy was in [place] and recent mammogram was in [place] as well.  Mood has been great. She is in a really good place now.   Energy isn't where she wants it to be.    Past Medical History:  Diagnosis Date   Eczema    Endometrial polyp    History of Bell's palsy    2012 left side ,  resolved   History of hypertension    08-04-2019  per pt over 10 yrs took medication briefly , lost wt and watch diet , no more medication since   History of melanoma in situ    06/ 2019  top of right foot excision, localized   IDA (iron deficiency anemia)    Past Surgical History:  Procedure Laterality Date   DILATATION & CURETTAGE/HYSTEROSCOPY WITH MYOSURE N/A 08/12/2019   Procedure: [place];  Surgeon: Princess Bruins, MD;  Location: [facility];  Service: Gynecology;  Laterality: N/A;  Request 7:30am OR time in [place] Gyn block requests 45 minutes   ESOPHAGOGASTRODUODENOSCOPY  2012 approx.   ROBOTIC ASSISTED TOTAL HYSTERECTOMY WITH BILATERAL SALPINGO OOPHERECTOMY Bilateral 11/25/2019   Procedure: XI ROBOTIC ASSISTED TOTAL HYSTERECTOMY WITH BILATERAL SALPINGECTOMY;  Surgeon: Princess Bruins, MD;  Location: [facility];  Service: Gynecology;  Laterality: Bilateral;   No Known Allergies Current Meds  Medication Sig   Ascorbic Acid  (VITAMIN C PO) Take by mouth.   ibuprofen (ADVIL) 200 MG tablet Take 400 mg by mouth every 6 (six) hours as needed for moderate pain.    MAGNESIUM PO Take by mouth.   Multiple Vitamin (MULTIVITAMIN) tablet Take 1 tablet by mouth daily.   Omega-3 Fatty Acids (FISH OIL PO) Take by mouth.   TURMERIC PO Take by mouth.   [DISCONTINUED] fluticasone (CUTIVATE) 0.05 % cream Apply 1 application topically daily as needed (eczema).   [DISCONTINUED] oxyCODONE-acetaminophen (PERCOCET) 7.5-325 MG tablet Take 1 tablet by mouth every 6 (six) hours as needed for severe pain.   Social History   Tobacco Use   Smoking status: Former    Packs/day: 0.10    Years: 20.00    Pack years: 2.00    Types: Cigarettes    Quit date: 08/03/2014    Years since quitting: 6.9   Smokeless tobacco: Never  Substance Use Topics   Alcohol use: Yes    Alcohol/week: 6.0 standard drinks    Types: 3 Glasses of wine, 3 Cans of beer per week    Comment: ocaasionally   Family History  Problem Relation Age of Onset   Pancreatic cancer Father    Cancer Father    Breast cancer Maternal Grandmother 56   Cancer Maternal Grandmother    Diabetes Mother        borderline   Alcohol abuse Mother    Arthritis Mother    Depression Mother    Hypertension Mother  Learning disabilities Mother    Alcohol abuse Maternal Grandfather    Eczema Brother    Hepatitis C Brother    Other Brother        suicide   Depression Brother    Drug abuse Brother    Early death Brother    Learning disabilities Brother      Review of Systems  Constitutional:  Negative for activity change, appetite change, chills, fatigue, fever and unexpected weight change.  HENT:  Negative for congestion, ear pain, hearing loss, sinus pressure, sinus pain, sore throat and trouble swallowing.   Eyes:  Negative for pain and visual disturbance.  Respiratory:  Negative for cough, chest tightness, shortness of breath and wheezing.   Cardiovascular:  Negative for  chest pain, palpitations and leg swelling.  Gastrointestinal:  Negative for abdominal pain, blood in stool, constipation, diarrhea, nausea and vomiting.  Genitourinary:  Negative for difficulty urinating and menstrual problem.  Musculoskeletal:  Negative for arthralgias and back pain.  Skin:  Negative for rash.  Neurological:  Negative for dizziness, weakness, numbness and headaches.  Hematological:  Negative for adenopathy. Does not bruise/bleed easily.  Psychiatric/Behavioral:  Negative for sleep disturbance and suicidal ideas. The patient is not nervous/anxious.    CBC:  Lab Results  Component Value Date   WBC 5.8 07/22/2021   HGB 14.4 07/22/2021   HCT 42.1 07/22/2021   MCH 30.6 07/22/2021   MCHC 34.2 07/22/2021   RDW 11.9 07/22/2021   PLT 325 07/22/2021   MPV 11.0 07/22/2021   CMP: Lab Results  Component Value Date   NA 138 07/22/2021   K 4.0 07/22/2021   CL 102 07/22/2021   CO2 21 07/22/2021   ANIONGAP 10 11/19/2019   GLUCOSE 88 07/22/2021   BUN 14 07/22/2021   CREATININE 0.80 07/22/2021   GFRAA >60 11/19/2019   CALCIUM 9.8 07/22/2021   PROT 7.6 07/22/2021   BILITOT 0.8 07/22/2021   ALT 14 07/22/2021   AST 15 07/22/2021   LIPID: Lab Results  Component Value Date   CHOL 204 (H) 07/22/2021   TRIG 124 07/22/2021   HDL 60 07/22/2021   LDLCALC 121 (H) 07/22/2021    Objective:  BP (!) 150/82 (BP Location: Right Arm, Cuff Size: Normal)    Pulse (!) 57    Temp 98.5 F (36.9 C) (Oral)    Ht 5\' 4"  (1.626 m)    Wt 160 lb 9.6 oz (72.8 kg)    LMP 10/25/2019    SpO2 99%    BMI 27.57 kg/m   Weight: 160 lb 9.6 oz (72.8 kg)   BP Readings from Last 3 Encounters:  07/22/21 (!) 150/82  02/04/20 124/80  12/12/19 136/88   Wt Readings from Last 3 Encounters:  07/22/21 160 lb 9.6 oz (72.8 kg)  11/25/19 162 lb (73.5 kg)  11/19/19 159 lb 3 oz (72.2 kg)    Physical Exam Constitutional:      General: She is not in acute distress.    Appearance: She is well-developed.   HENT:     Head: Normocephalic and atraumatic.     Right Ear: External ear normal.     Left Ear: External ear normal.     Mouth/Throat:     Pharynx: No oropharyngeal exudate.  Eyes:     Conjunctiva/sclera: Conjunctivae normal.     Pupils: Pupils are equal, round, and reactive to light.  Neck:     Thyroid: No thyromegaly.  Cardiovascular:     Rate and Rhythm: Normal  rate and regular rhythm.     Heart sounds: Normal heart sounds. No murmur heard.   No friction rub. No gallop.  Pulmonary:     Effort: Pulmonary effort is normal.     Breath sounds: Normal breath sounds.  Abdominal:     General: Bowel sounds are normal. There is no distension.     Palpations: Abdomen is soft. There is no mass.     Tenderness: There is no abdominal tenderness. There is no guarding.     Hernia: No hernia is present.  Genitourinary:    Urethra: Prolapse: very minimal prominence.     Vagina: Vaginal discharge (scant white discharge) present.  Musculoskeletal:        General: No tenderness or deformity. Normal range of motion.     Cervical back: Normal range of motion and neck supple.  Lymphadenopathy:     Cervical: No cervical adenopathy.  Skin:    General: Skin is warm and dry.     Findings: No rash.  Neurological:     Mental Status: She is alert and oriented to person, place, and time.     Deep Tendon Reflexes: Reflexes normal.     Reflex Scores:      Tricep reflexes are 2+ on the right side and 2+ on the left side.      Bicep reflexes are 2+ on the right side and 2+ on the left side.      Brachioradialis reflexes are 2+ on the right side and 2+ on the left side.      Patellar reflexes are 2+ on the right side and 2+ on the left side. Psychiatric:        Speech: Speech normal.        Behavior: Behavior normal.        Thought Content: Thought content normal.    Assessment/Plan: Health Maintenance Due  Topic Date Due   Pneumococcal Vaccine 76-12 Years old (1 - PCV) Never done   COVID-19  Vaccine (3 - Pfizer risk series) 12/09/2019   Health Maintenance reviewed.  1. Preventative health care Keep up with regular exercise and healthy eating.  Up-to-date with immunizations.  2. Iron deficiency Recheck labs today fact that these will be better status post hysterectomy. - IBC + Ferritin; Future - Iron, TIBC and Ferritin Panel; Future - Iron, TIBC and Ferritin Panel  3. Fatigue, unspecified type - CBC with Differential/Platelet; Future - Comprehensive metabolic panel; Future - TSH; Future - Vitamin B12; Future - CBC with Differential/Platelet - Comprehensive metabolic panel - TSH - Vitamin B12  4. Lipid screening - Lipid panel; Future - Lipid panel  5. Vitamin D deficiency - VITAMIN D 25 Hydroxy (Vit-D Deficiency, Fractures); Future - VITAMIN D 25 Hydroxy (Vit-D Deficiency, Fractures)  6. Need for shingles vaccine - Varicella-zoster vaccine IM (Shingrix)   Return in about 1 year (around 07/22/2022) for physical exam.  Micheline Rough, MD

## 2021-07-23 LAB — COMPREHENSIVE METABOLIC PANEL
AG Ratio: 1.7 (calc) (ref 1.0–2.5)
ALT: 14 U/L (ref 6–29)
AST: 15 U/L (ref 10–35)
Albumin: 4.8 g/dL (ref 3.6–5.1)
Alkaline phosphatase (APISO): 62 U/L (ref 37–153)
BUN: 14 mg/dL (ref 7–25)
CO2: 21 mmol/L (ref 20–32)
Calcium: 9.8 mg/dL (ref 8.6–10.4)
Chloride: 102 mmol/L (ref 98–110)
Creat: 0.8 mg/dL (ref 0.50–1.03)
Globulin: 2.8 g/dL (calc) (ref 1.9–3.7)
Glucose, Bld: 88 mg/dL (ref 65–99)
Potassium: 4 mmol/L (ref 3.5–5.3)
Sodium: 138 mmol/L (ref 135–146)
Total Bilirubin: 0.8 mg/dL (ref 0.2–1.2)
Total Protein: 7.6 g/dL (ref 6.1–8.1)

## 2021-07-23 LAB — CBC WITH DIFFERENTIAL/PLATELET
Absolute Monocytes: 365 cells/uL (ref 200–950)
Basophils Absolute: 29 cells/uL (ref 0–200)
Basophils Relative: 0.5 %
Eosinophils Absolute: 273 cells/uL (ref 15–500)
Eosinophils Relative: 4.7 %
HCT: 42.1 % (ref 35.0–45.0)
Hemoglobin: 14.4 g/dL (ref 11.7–15.5)
Lymphs Abs: 2088 cells/uL (ref 850–3900)
MCH: 30.6 pg (ref 27.0–33.0)
MCHC: 34.2 g/dL (ref 32.0–36.0)
MCV: 89.6 fL (ref 80.0–100.0)
MPV: 11 fL (ref 7.5–12.5)
Monocytes Relative: 6.3 %
Neutro Abs: 3045 cells/uL (ref 1500–7800)
Neutrophils Relative %: 52.5 %
Platelets: 325 10*3/uL (ref 140–400)
RBC: 4.7 10*6/uL (ref 3.80–5.10)
RDW: 11.9 % (ref 11.0–15.0)
Total Lymphocyte: 36 %
WBC: 5.8 10*3/uL (ref 3.8–10.8)

## 2021-07-23 LAB — LIPID PANEL
Cholesterol: 204 mg/dL — ABNORMAL HIGH (ref <200)
HDL: 60 mg/dL (ref >=50)
LDL Cholesterol (Calc): 121 mg/dL (calc) — ABNORMAL HIGH
Non-HDL Cholesterol (Calc): 144 mg/dL (calc) — ABNORMAL HIGH (ref <130)
Total CHOL/HDL Ratio: 3.4 (calc) (ref <5.0)
Triglycerides: 124 mg/dL (ref <150)

## 2021-07-23 LAB — TSH: TSH: 2.88 mIU/L

## 2021-07-23 LAB — IRON,TIBC AND FERRITIN PANEL
%SAT: 18 % (calc) (ref 16–45)
Ferritin: 71 ng/mL (ref 16–232)
Iron: 80 ug/dL (ref 45–160)
TIBC: 447 mcg/dL (calc) (ref 250–450)

## 2021-07-23 LAB — VITAMIN B12: Vitamin B-12: 539 pg/mL (ref 200–1100)

## 2021-07-23 LAB — VITAMIN D 25 HYDROXY (VIT D DEFICIENCY, FRACTURES): Vit D, 25-Hydroxy: 28 ng/mL — ABNORMAL LOW (ref 30–100)

## 2021-09-09 ENCOUNTER — Encounter: Payer: Self-pay | Admitting: Family Medicine

## 2021-09-09 ENCOUNTER — Ambulatory Visit: Payer: 59 | Admitting: Family Medicine

## 2021-09-09 VITALS — BP 142/90 | HR 70 | Temp 99.2°F | Ht 64.0 in | Wt 160.9 lb

## 2021-09-09 DIAGNOSIS — R0602 Shortness of breath: Secondary | ICD-10-CM | POA: Diagnosis not present

## 2021-09-09 DIAGNOSIS — I10 Essential (primary) hypertension: Secondary | ICD-10-CM | POA: Diagnosis not present

## 2021-09-09 MED ORDER — LOSARTAN POTASSIUM 50 MG PO TABS: 50.0000 mg | ORAL_TABLET | Freq: Every day | ORAL | 1 refills | Status: DC

## 2021-09-09 MED ORDER — TRIAMCINOLONE ACETONIDE 0.1 % EX CREA: 1.0000 "application " | TOPICAL_CREAM | Freq: Two times a day (BID) | CUTANEOUS | 5 refills | Status: DC

## 2021-09-09 NOTE — Progress Notes (Signed)
Megan Kelley DOB: 21-Oct-1970 Encounter date: 09/09/2021  This is a 51 y.o. female who presents with Chief Complaint  Patient presents with   Hypertension    Patient states her blood pressure is still elevated and "she feels different when exercising"    History of present illness: Bp elevated at last visit. Has been monitoring. Feels a little different in chest when she is exercising. Feeling like she needs to stop, bend over. Legs are not as cooperative. Drives back and forth on weekend - almost feels like she has vibration in chest. Doesn't want to say tightness, but maybe buzzing sort of feeling in head. Has happened twice while driving. Can't feel heart racing. Since she has been driving, hasn't checked pulse. Almost feels short of breath with exercise,not enough oxygen. She feels well overall though. Did have extra caffeine drink for drive. Wonders if this is related. For last couple of weeks has cut back on caffeine. Doesn't have cuff and hasn't checked outside of here, but has noted it has been in 140's when she has been at doc office. States that even with colonoscopy they were rechecking a number of times. Never improved really on rechecks. Has had some fast or hard heart beat at night; waking up with it. Used to happen more in the past and was prior to quitting smoking.   Brought in mammogram report and colonoscopy report.   Using triamcinolone for eczema. Wanting refill today.  No Known Allergies Current Meds  Medication Sig   Ascorbic Acid (VITAMIN C PO) Take by mouth.   ibuprofen (ADVIL) 200 MG tablet Take 400 mg by mouth every 6 (six) hours as needed for moderate pain.    losartan (COZAAR) 50 MG tablet Take 1 tablet (50 mg total) by mouth daily.   MAGNESIUM PO Take by mouth.   Multiple Vitamin (MULTIVITAMIN) tablet Take 1 tablet by mouth daily.   Omega-3 Fatty Acids (FISH OIL PO) Take by mouth.   triamcinolone cream (KENALOG) 0.1 % Apply 1 application topically 2 (two)  times daily.   TURMERIC PO Take by mouth.    Review of Systems  Constitutional:  Negative for chills, fatigue and fever.  Respiratory:  Positive for shortness of breath. Negative for cough, chest tightness and wheezing.   Cardiovascular:  Negative for chest pain, palpitations and leg swelling.   Objective:  BP (!) 142/90 (BP Location: Left Arm, Patient Position: Sitting, Cuff Size: Normal)    Pulse 70    Temp 99.2 F (37.3 C) (Oral)    Ht 5\' 4"  (1.626 m)    Wt 160 lb 14.4 oz (73 kg)    LMP 10/25/2019    SpO2 97%    BMI 27.62 kg/m   Weight: 160 lb 14.4 oz (73 kg)   BP Readings from Last 3 Encounters:  09/09/21 (!) 142/90  07/22/21 (!) 150/82  02/04/20 124/80   Wt Readings from Last 3 Encounters:  09/09/21 160 lb 14.4 oz (73 kg)  07/22/21 160 lb 9.6 oz (72.8 kg)  11/25/19 162 lb (73.5 kg)    Physical Exam Constitutional:      General: She is not in acute distress.    Appearance: She is well-developed.  Cardiovascular:     Rate and Rhythm: Normal rate and regular rhythm.     Heart sounds: Normal heart sounds. No murmur heard.   No friction rub.  Pulmonary:     Effort: Pulmonary effort is normal. No respiratory distress.     Breath sounds:  Normal breath sounds. No wheezing or rales.  Musculoskeletal:     Right lower leg: No edema.     Left lower leg: No edema.  Neurological:     Mental Status: She is alert and oriented to person, place, and time.  Psychiatric:        Behavior: Behavior normal.    Assessment/Plan  1. Hypertension, unspecified type We are going to start losartan for her for better blood pressure control.  Encouraged her to get a cuff and check at home.  She will report back numbers to me in about 2 weeks so we can adjust medication if needed.  Discussed goals of blood pressure with her. - EKG 12-Lead  2. Shortness of breath She has a difficult time describing symptoms with exercise, but does not feel normal.  EKG looks great in the office today.  Normal  sinus rhythm.  No acute changes.  We will get stress echo for further evaluation under exercise conditions to make sure that this looks good. - DG Chest 2 View; Future  Return in about 6 weeks (around 10/21/2021).     Micheline Rough, MD

## 2021-09-09 NOTE — Patient Instructions (Addendum)
Check blood pressures at home 2 or less times daily. Our goal for you would be between 110-130/65-80.   *it can take a couple of weeks for losartan to kick in .  *update me via mychart in 2-3 weeks time. Let me know last few days of numbers. Contact me sooner if pressures are not improving.   Keep track of how you feel -with exercise, daily energy, etc.   If you are not feeling better in the next 1-2 weeks, let me know. We discussed cxr (ordered); and we discussed stress testing.

## 2021-09-13 ENCOUNTER — Encounter: Payer: Self-pay | Admitting: Family Medicine

## 2021-09-26 ENCOUNTER — Encounter: Payer: Self-pay | Admitting: Family Medicine

## 2021-09-29 ENCOUNTER — Other Ambulatory Visit (HOSPITAL_COMMUNITY): Payer: 59

## 2021-09-30 ENCOUNTER — Telehealth: Payer: Self-pay | Admitting: Family Medicine

## 2021-09-30 MED ORDER — LOSARTAN POTASSIUM 50 MG PO TABS: 50.0000 mg | ORAL_TABLET | Freq: Every day | ORAL | 1 refills | Status: DC

## 2021-09-30 NOTE — Telephone Encounter (Signed)
Rx done. 

## 2021-09-30 NOTE — Telephone Encounter (Signed)
Patient called in requesting refill for losartan (COZAAR) 50 MG tablet [[id number]]  to be sent to her pharmacy.  The pharmacy is the [facility] at [address]  Patient only have two days worth left.   Please advise.

## 2021-10-01 MED ORDER — LOSARTAN POTASSIUM 100 MG PO TABS: 100.0000 mg | ORAL_TABLET | Freq: Every day | ORAL | 1 refills | Status: DC

## 2021-10-12 ENCOUNTER — Telehealth (HOSPITAL_COMMUNITY): Payer: Self-pay | Admitting: *Deleted

## 2021-10-12 NOTE — Telephone Encounter (Signed)
Patient given detailed instructions per Stress Test Requisition Sheet for test on 10/19/2021 at 2:00.Patient Notified to arrive 30 minutes early, and that it is imperative to arrive on time for appointment to keep from having the test rescheduled.  Patient verbalized understanding. Glade Lloyd S ? ?  ? ?

## 2021-10-19 ENCOUNTER — Ambulatory Visit (HOSPITAL_BASED_OUTPATIENT_CLINIC_OR_DEPARTMENT_OTHER): Payer: 59

## 2021-10-19 ENCOUNTER — Encounter (HOSPITAL_COMMUNITY): Payer: Self-pay | Admitting: Pharmacy Technician

## 2021-10-19 ENCOUNTER — Emergency Department (HOSPITAL_COMMUNITY)
Admission: EM | Admit: 2021-10-19 | Discharge: 2021-10-19 | Disposition: A | Discharge status: Home / Self Care | Payer: 59 | Attending: Emergency Medicine | Admitting: Emergency Medicine

## 2021-10-19 ENCOUNTER — Emergency Department (HOSPITAL_COMMUNITY): Payer: 59

## 2021-10-19 ENCOUNTER — Encounter: Payer: Self-pay | Admitting: Family Medicine

## 2021-10-19 ENCOUNTER — Ambulatory Visit (HOSPITAL_COMMUNITY): Payer: 59

## 2021-10-19 ENCOUNTER — Other Ambulatory Visit: Payer: Self-pay

## 2021-10-19 DIAGNOSIS — R0602 Shortness of breath: Secondary | ICD-10-CM

## 2021-10-19 DIAGNOSIS — I10 Essential (primary) hypertension: Secondary | ICD-10-CM | POA: Insufficient documentation

## 2021-10-19 DIAGNOSIS — R200 Anesthesia of skin: Secondary | ICD-10-CM | POA: Diagnosis not present

## 2021-10-19 LAB — CBC WITH DIFFERENTIAL/PLATELET
Abs Immature Granulocytes: 0.02 10*3/uL (ref 0.00–0.07)
Basophils Absolute: 0 10*3/uL (ref 0.0–0.1)
Basophils Relative: 0 %
Eosinophils Absolute: 0.2 10*3/uL (ref 0.0–0.5)
Eosinophils Relative: 2 %
HCT: 37.1 % (ref 36.0–46.0)
Hemoglobin: 12.6 g/dL (ref 12.0–15.0)
Immature Granulocytes: 0 %
Lymphocytes Relative: 32 %
Lymphs Abs: 2.2 10*3/uL (ref 0.7–4.0)
MCH: 29.7 pg (ref 26.0–34.0)
MCHC: 34 g/dL (ref 30.0–36.0)
MCV: 87.5 fL (ref 80.0–100.0)
Monocytes Absolute: 0.5 10*3/uL (ref 0.1–1.0)
Monocytes Relative: 7 %
Neutro Abs: 4 10*3/uL (ref 1.7–7.7)
Neutrophils Relative %: 59 %
Platelets: 292 10*3/uL (ref 150–400)
RBC: 4.24 MIL/uL (ref 3.87–5.11)
RDW: 12.5 % (ref 11.5–15.5)
WBC: 6.9 10*3/uL (ref 4.0–10.5)
nRBC: 0 % (ref 0.0–0.2)

## 2021-10-19 LAB — COMPREHENSIVE METABOLIC PANEL
ALT: 36 U/L (ref 0–44)
AST: 24 U/L (ref 15–41)
Albumin: 3.9 g/dL (ref 3.5–5.0)
Alkaline Phosphatase: 60 U/L (ref 38–126)
Anion gap: 13 (ref 5–15)
BUN: 16 mg/dL (ref 6–20)
CO2: 24 mmol/L (ref 22–32)
Calcium: 9.2 mg/dL (ref 8.9–10.3)
Chloride: 103 mmol/L (ref 98–111)
Creatinine, Ser: 0.85 mg/dL (ref 0.44–1.00)
GFR, Estimated: >60 mL/min (ref >60)
Glucose, Bld: 112 mg/dL — ABNORMAL HIGH (ref 70–99)
Potassium: 3.5 mmol/L (ref 3.5–5.1)
Sodium: 140 mmol/L (ref 135–145)
Total Bilirubin: 0.4 mg/dL (ref 0.3–1.2)
Total Protein: 6.6 g/dL (ref 6.5–8.1)

## 2021-10-19 LAB — TROPONIN I (HIGH SENSITIVITY): Troponin I (High Sensitivity): 13 ng/L (ref <18)

## 2021-10-19 LAB — ECHOCARDIOGRAM STRESS TEST
Area-P 1/2: 3.68 cm2
S' Lateral: 2.8 cm

## 2021-10-19 MED ORDER — PERFLUTREN LIPID MICROSPHERE
1.0000 mL | INTRAVENOUS | Status: AC | PRN
  Administered 2021-10-19: 3 mL via INTRAVENOUS

## 2021-10-19 NOTE — ED Triage Notes (Addendum)
Pt here with reports of hypertension. Pt with numbness and tingling in arm.  ?

## 2021-10-19 NOTE — Discharge Instructions (Addendum)
Please return to the ED with any new symptoms such as chest pain, shortness of breath, headache, blurred vision ?Please follow-up with her PCP in the next week ?Please read the attached informational guidelines concerning high blood pressure ?Please begin recording your blood pressure at home ?

## 2021-10-19 NOTE — Telephone Encounter (Signed)
I called the patient and informed her she should go the emergency room immediately due to the symptoms below.  Patient stated she has a stress test today and I advised her again she should go the ER as this type of testing is generally ordered to evaluate a previous problem.  Offered info on locations close to the patient's home and she stated she is aware of an emergency room close to her home.   ?

## 2021-10-19 NOTE — ED Provider Notes (Signed)
?[facility] ?Provider Note ? ? ?CSN: [id number] ?Arrival date & time: 10/19/21  1731 ? ?  ? ?History ? ?Chief Complaint  ?Patient presents with  ? Hypertension  ? ? ?November Sypher is a 51 y.o. female with medical history of Bell's palsy, hypertension, iron deficiency anemia, eczema.  Patient presents to ED due to hypertension.  Patient states that "for the last few months" her blood pressure has began to increase.  Patient was started on 50 mg losartan at the beginning of February and then doubled to 100 mg losartan of the last 2 weeks due to the fact that her blood pressure was not being controlled on the original dosage.  Patient here today complaining of numbness into her left arm, left leg.  Patient states that earlier today she underwent outpatient cardiac stress testing and was noted to be hypertensive at this time however outpatient cardiology group decided that her blood pressure was not disqualifying to undergo stress test.  Patient states that she completed the entirety of the stress test and then called her PCP due to the fact that she had left arm numbness.  Her PCP advised her to present to the ED for further evaluation.  Patient adds at the end of the interview that she has been having some left shoulder pain recently, she thought that it was a result of working out strenuously.  Patient is endorsing left arm numbness, left leg numbness.  The patient denies any headache, chest pain, shortness of breath, blurred vision, dizziness, lightheadedness, weakness, back pain. ? ? ?Hypertension ?Pertinent negatives include no chest pain, no headaches and no shortness of breath.  ? ?  ? ?Home Medications ?Prior to Admission medications   ?Medication Sig Start Date End Date Taking? Authorizing Provider  ?Ascorbic Acid (VITAMIN C PO) Take by mouth.    [provider]  ?ibuprofen (ADVIL) 200 MG tablet Take 400 mg by mouth every 6 (six) hours as needed for moderate  pain.     [provider]  ?losartan (COZAAR) 100 MG tablet Take 1 tablet (100 mg total) by mouth daily. 10/01/21   Caren Macadam, MD  ?MAGNESIUM PO Take by mouth.    [provider]  ?Multiple Vitamin (MULTIVITAMIN) tablet Take 1 tablet by mouth daily.    [provider]  ?Omega-3 Fatty Acids (FISH OIL PO) Take by mouth.    [provider]  ?triamcinolone cream (KENALOG) 0.1 % Apply 1 application topically 2 (two) times daily. 09/09/21   Caren Macadam, MD  ?TURMERIC PO Take by mouth.    [provider]  ?   ? ?Allergies    ?Patient has no known allergies.   ? ?Review of Systems   ?Review of Systems  ?Eyes:  Negative for visual disturbance.  ?Respiratory:  Negative for shortness of breath.   ?Cardiovascular:  Negative for chest pain.  ?Musculoskeletal:  Negative for back pain.  ?Neurological:  Positive for numbness (Left leg and arm). Negative for dizziness, weakness, light-headedness and headaches.  ?All other systems reviewed and are negative. ? ?Physical Exam ?Updated Vital Signs ?BP (!) 179/97 (BP Location: Right Arm)   Pulse 78   Temp 98.7 ?F (37.1 ?C) (Oral)   Resp 16   LMP 10/25/2019   SpO2 98%  ?Physical Exam ?Vitals and nursing note reviewed.  ?Constitutional:   ?   General: She is not in acute distress. ?   Appearance: Normal appearance. She is not ill-appearing, toxic-appearing  or diaphoretic.  ?HENT:  ?   Head: Normocephalic and atraumatic.  ?   Nose: Nose normal. No congestion.  ?   Mouth/Throat:  ?   Mouth: Mucous membranes are moist.  ?   Pharynx: Oropharynx is clear.  ?Eyes:  ?   Extraocular Movements: Extraocular movements intact.  ?   Conjunctiva/sclera: Conjunctivae normal.  ?   Pupils: Pupils are equal, round, and reactive to light.  ?Cardiovascular:  ?   Rate and Rhythm: Normal rate and regular rhythm.  ?Pulmonary:  ?   Effort: Pulmonary effort is normal.  ?   Breath sounds: Normal breath sounds. No wheezing.  ?Abdominal:  ?   General:  Abdomen is flat. Bowel sounds are normal.  ?   Palpations: Abdomen is soft.  ?   Tenderness: There is no abdominal tenderness.  ?Musculoskeletal:  ?   Cervical back: Normal range of motion and neck supple. No tenderness.  ?Skin: ?   General: Skin is warm and dry.  ?   Capillary Refill: Capillary refill takes less than 2 seconds.  ?Neurological:  ?   Mental Status: She is alert and oriented to person, place, and time.  ?   GCS: GCS eye subscore is 4. GCS verbal subscore is 5. GCS motor subscore is 6.  ?   Cranial Nerves: Cranial nerves 2-12 are intact. No cranial nerve deficit.  ?   Sensory: Sensory deficit present.  ?   Motor: Motor function is intact. No weakness.  ?   Coordination: Coordination is intact. Coordination normal. Heel to Shin Test normal.  ?   Comments: Patient complaining of diffuse left leg and left arm numbness.  Patient also states that she has an area on the left side of her forehead that she feels is numb  ? ? ?ED Results / Procedures / Treatments   ?Labs ?(all labs ordered are listed, but only abnormal results are displayed) ?Labs Reviewed  ?COMPREHENSIVE METABOLIC PANEL - Abnormal; Notable for the following components:  ?    Result Value  ? Glucose, Bld 112 (*)   ? All other components within normal limits  ?CBC WITH DIFFERENTIAL/PLATELET  ?TROPONIN I (HIGH SENSITIVITY)  ? ? ?EKG ?None ? ?Radiology ?DG Chest 2 View ? ?Result Date: 10/19/2021 ?CLINICAL DATA:  Chest pain, hypertension. EXAM: CHEST - 2 VIEW COMPARISON:  None. FINDINGS: The heart size and mediastinal contours are within normal limits. No consolidation, effusion, or pneumothorax. Mild degenerative changes in the thoracic spine. IMPRESSION: No active cardiopulmonary disease. Electronically Signed   By: Brett Fairy M.D.   On: 10/19/2021 20:00  ? ?MR BRAIN WO CONTRAST ? ?Result Date: 10/19/2021 ?CLINICAL DATA:  Left arm, left leg, and left face numbness, stroke suspected EXAM: MRI HEAD WITHOUT CONTRAST TECHNIQUE: Multiplanar,  multiecho pulse sequences of the brain and surrounding structures were obtained without intravenous contrast. COMPARISON:  None. FINDINGS: Brain: No restricted diffusion to suggest acute or subacute infarct. No acute hemorrhage, mass, mass effect, or midline shift. No hydrocephalus or extra-axial collection. Vascular: Normal flow voids. Skull and upper cervical spine: Normal marrow signal. Sinuses/Orbits: Mucous retention cyst in the right maxillary sinus. The paranasal sinuses are otherwise clear. The orbits are unremarkable. Other: The mastoids are well aerated. IMPRESSION: No acute intracranial process. Electronically Signed   By: Merilyn Baba M.D.   On: 10/19/2021 21:39   ? ?Procedures ?Procedures  ? ? ?Medications Ordered in ED ?Medications - No data to display ? ?ED Course/ Medical Decision Making/ A&P ?  ?                        ?  Medical Decision Making ?Amount and/or Complexity of Data Reviewed ?Radiology: ordered. ? ? ?51 year old female presents ED for evaluation.  Please see HPI for further details. ? ?On examination, the patient is afebrile, nontachycardic, nonhypoxic.  The patient's lung sounds are clear bilaterally.  Patient's abdomen is soft compressible all 4 quadrants.  Patient nontoxic in appearance.  Patient neurological examination shows no focal neurodeficits. ? ?Patient worked up utilizing following imaging studies interpreted by me personally: ?- MRI brain does not show any sign of intracranial abnormality to explain facial numbness, arm numbness, leg numbness. ? ?At this time, the patient stable for discharge.  Patient discussed with my attending Dr. Dina Rich who agrees.  Patient provided return precautions and she voiced understanding.  Patient had all her questions answered to her satisfaction.  Patient stable at this time. ? ? ?Final Clinical Impression(s) / ED Diagnoses ?Final diagnoses:  ?Hypertension, unspecified type  ? ? ?Rx / DC Orders ?ED Discharge Orders   ? ? None  ? ?  ? ? ?   ?Azucena Cecil, PA-C ?10/19/21 2211 ? ?  ?Lorelle Gibbs, DO ?10/19/21 2348 ? ?

## 2021-10-19 NOTE — ED Provider Triage Note (Signed)
Emergency Medicine Provider Triage Evaluation Note ? ?Megan Kelley , a 51 y.o. female  was evaluated in triage.  Pt complains of HTN for the last few months.  Started on blood pressure medication losartan 50 mg in the month of February, this was then doubled to 100 mg of losartan over the past 2 weeks there has not been any improvement in her blood pressure.  She noted her blood pressure to be elevated, also report feeling some numbness and tingling down her left arm.  Reports that she is not sure whether this is due to exercise, although she is only been stretching lately.  No diabetes, no history of CAD, no family history CAD.  No prior history of blood clots. ? ?Review of Systems  ?Positive: Left arm tingling ?Negative: Chest pain, SOB ? ?Physical Exam  ?BP (!) 159/85 (BP Location: Right Arm)   Pulse 75   Temp 98.7 ?F (37.1 ?C) (Oral)   Resp 15   LMP 10/25/2019   SpO2 97%  ?Gen:   Awake, no distress   ?Resp:  Normal effort  ?MSK:   Moves extremities without difficulty  ?Other:   ? ?Medical Decision Making  ?Medically screening exam initiated at 6:39 PM.  Appropriate orders placed.  Megan Kelley was informed that the remainder of the evaluation will be completed by another provider, this initial triage assessment does not replace that evaluation, and the importance of remaining in the ED until their evaluation is complete. ? ? ?  ?Janeece Fitting, PA-C ?10/19/21 1845 ? ?

## 2021-10-19 NOTE — ED Provider Notes (Signed)
Formatting of this note is different from the original.  Images from the original note were not included.    [facility]  Provider Note    CSN: [id number]  Arrival date & time: 10/19/21  1731        History    Chief Complaint   Patient presents with    Hypertension     Breanna Chang is a 51 y.o. female with medical history of Bell's palsy, hypertension, iron deficiency anemia, eczema.  Patient presents to ED due to hypertension.  Patient states that "for the last few months" her blood pressure has began to increase.  Patient was started on 50 mg losartan at the beginning of February and then doubled to 100 mg losartan of the last 2 weeks due to the fact that her blood pressure was not being controlled on the original dosage.  Patient here today complaining of numbness into her left arm, left leg.  Patient states that earlier today she underwent outpatient cardiac stress testing and was noted to be hypertensive at this time however outpatient cardiology group decided that her blood pressure was not disqualifying to undergo stress test.  Patient states that she completed the entirety of the stress test and then called her PCP due to the fact that she had left arm numbness.  Her PCP advised her to present to the ED for further evaluation.  Patient adds at the end of the interview that she has been having some left shoulder pain recently, she thought that it was a result of working out strenuously.  Patient is endorsing left arm numbness, left leg numbness.  The patient denies any headache, chest pain, shortness of breath, blurred vision, dizziness, lightheadedness, weakness, back pain.    Hypertension  Pertinent negatives include no chest pain, no headaches and no shortness of breath.         Home Medications  Prior to Admission medications    Medication Sig Start Date End Date Taking? Authorizing Provider   Ascorbic Acid (VITAMIN C PO) Take by mouth.    [provider]    ibuprofen (ADVIL) 200 MG tablet Take 400 mg by mouth every 6 (six) hours as needed for moderate pain.     [provider]   losartan (COZAAR) 100 MG tablet Take 1 tablet (100 mg total) by mouth daily. 10/01/21   Wynn BankerKoberlein, Junell C, MD   MAGNESIUM PO Take by mouth.    [provider]   Multiple Vitamin (MULTIVITAMIN) tablet Take 1 tablet by mouth daily.    [provider]   Omega-3 Fatty Acids (FISH OIL PO) Take by mouth.    [provider]   triamcinolone cream (KENALOG) 0.1 % Apply 1 application topically 2 (two) times daily. 09/09/21   Wynn BankerKoberlein, Junell C, MD   TURMERIC PO Take by mouth.    [provider]       Allergies     Patient has no known allergies.      Review of Systems    Review of Systems   Eyes:  Negative for visual disturbance.   Respiratory:  Negative for shortness of breath.    Cardiovascular:  Negative for chest pain.   Musculoskeletal:  Negative for back pain.   Neurological:  Positive for numbness (Left leg and arm). Negative for dizziness, weakness, light-headedness and headaches.   All other systems reviewed and are negative.    Physical Exam  Updated Vital Signs  BP [name] Kitchen(!)  179/97 (BP Location: Right Arm)   Pulse 78   Temp 98.7 F (37.1 C) (Oral)   Resp 16   LMP 10/25/2019   SpO2 98%   Physical Exam  Vitals and nursing note reviewed.   Constitutional:       General: She is not in acute distress.     Appearance: Normal appearance. She is not ill-appearing, toxic-appearing or diaphoretic.   HENT:      Head: Normocephalic and atraumatic.      Nose: Nose normal. No congestion.      Mouth/Throat:      Mouth: Mucous membranes are moist.      Pharynx: Oropharynx is clear.   Eyes:      Extraocular Movements: Extraocular movements intact.      Conjunctiva/sclera: Conjunctivae normal.      Pupils: Pupils are equal, round, and reactive to light.   Cardiovascular:      Rate and Rhythm: Normal rate and regular rhythm.   Pulmonary:      Effort: Pulmonary  effort is normal.      Breath sounds: Normal breath sounds. No wheezing.   Abdominal:      General: Abdomen is flat. Bowel sounds are normal.      Palpations: Abdomen is soft.      Tenderness: There is no abdominal tenderness.   Musculoskeletal:      Cervical back: Normal range of motion and neck supple. No tenderness.   Skin:     General: Skin is warm and dry.      Capillary Refill: Capillary refill takes less than 2 seconds.   Neurological:      Mental Status: She is alert and oriented to person, place, and time.      GCS: GCS eye subscore is 4. GCS verbal subscore is 5. GCS motor subscore is 6.      Cranial Nerves: Cranial nerves 2-12 are intact. No cranial nerve deficit.      Sensory: Sensory deficit present.      Motor: Motor function is intact. No weakness.      Coordination: Coordination is intact. Coordination normal. Heel to Shin Test normal.      Comments: Patient complaining of diffuse left leg and left arm numbness.  Patient also states that she has an area on the left side of her forehead that she feels is numb     ED Results / Procedures / Treatments    Labs  (all labs ordered are listed, but only abnormal results are displayed)  Labs Reviewed   COMPREHENSIVE METABOLIC PANEL - Abnormal; Notable for the following components:       Result Value    Glucose, Bld 112 (*)     All other components within normal limits   CBC WITH DIFFERENTIAL/PLATELET   TROPONIN I (HIGH SENSITIVITY)     EKG  None    Radiology  DG Chest 2 View    Result Date: 10/19/2021  CLINICAL DATA:  Chest pain, hypertension. EXAM: CHEST - 2 VIEW COMPARISON:  None. FINDINGS: The heart size and mediastinal contours are within normal limits. No consolidation, effusion, or pneumothorax. Mild degenerative changes in the thoracic spine. IMPRESSION: No active cardiopulmonary disease. Electronically Signed   By: Thornell Sartorius M.D.   On: 10/19/2021 20:00     MR BRAIN WO CONTRAST    Result Date: 10/19/2021  CLINICAL DATA:  Left arm, left leg, and left  face numbness, stroke suspected EXAM: MRI HEAD WITHOUT CONTRAST TECHNIQUE: Multiplanar, multiecho pulse  sequences of the brain and surrounding structures were obtained without intravenous contrast. COMPARISON:  None. FINDINGS: Brain: No restricted diffusion to suggest acute or subacute infarct. No acute hemorrhage, mass, mass effect, or midline shift. No hydrocephalus or extra-axial collection. Vascular: Normal flow voids. Skull and upper cervical spine: Normal marrow signal. Sinuses/Orbits: Mucous retention cyst in the right maxillary sinus. The paranasal sinuses are otherwise clear. The orbits are unremarkable. Other: The mastoids are well aerated. IMPRESSION: No acute intracranial process. Electronically Signed   By: Wiliam Ke M.D.   On: 10/19/2021 21:39      Procedures  Procedures     Medications Ordered in ED  Medications - No data to display    ED Course/ Medical Decision Making/ A&P      Medical Decision Making  Amount and/or Complexity of Data Reviewed  Radiology: ordered.    51 year old female presents ED for evaluation.  Please see HPI for further details.    On examination, the patient is afebrile, nontachycardic, nonhypoxic.  The patient's lung sounds are clear bilaterally.  Patient's abdomen is soft compressible all 4 quadrants.  Patient nontoxic in appearance.  Patient neurological examination shows no focal neurodeficits.    Patient worked up utilizing following imaging studies interpreted by me personally:  - MRI brain does not show any sign of intracranial abnormality to explain facial numbness, arm numbness, leg numbness.    At this time, the patient stable for discharge.  Patient discussed with my attending Dr. Wilkie Aye who agrees.  Patient provided return precautions and she voiced understanding.  Patient had all her questions answered to her satisfaction.  Patient stable at this time.    Final Clinical Impression(s) / ED Diagnoses  Final diagnoses:   Hypertension, unspecified type     Rx / DC  Orders  ED Discharge Orders       None           Al Decant, PA-C  10/19/21 2211      Rozelle Logan, DO  10/19/21 2348    Electronically signed by Rozelle Logan, DO at 10/19/2021 11:48 PM EDT    Associated attestation - Rozelle Logan, DO - 10/19/2021 11:48 PM EDT  Formatting of this note might be different from the original.  Attestation: I provided a substantive portion of the care of this patient.  I personally performed the entirety of the medical decision making for this encounter.    51 year old female presents emergency department hypertension, left-sided face/upper and lower extremity numbness.  Plan to treat this hypertension symptomatically however given the new focal neurodeficits in the setting of hypertension recommended MRI to rule out TIA/CVA.  MRI is negative, plan for outpatient treatment of hypertension.

## 2021-10-19 NOTE — Unmapped (Signed)
Formatting of this note is different from the original.  Emergency Medicine Provider Triage Evaluation Note    Breanna Chang , a 51 y.o. female  was evaluated in triage.  Pt complains of HTN for the last few months.  Started on blood pressure medication losartan 50 mg in the month of February, this was then doubled to 100 mg of losartan over the past 2 weeks there has not been any improvement in her blood pressure.  She noted her blood pressure to be elevated, also report feeling some numbness and tingling down her left arm.  Reports that she is not sure whether this is due to exercise, although she is only been stretching lately.  No diabetes, no history of CAD, no family history CAD.  No prior history of blood clots.    Review of Systems   Positive: Left arm tingling  Negative: Chest pain, SOB    Physical Exam   BP (!) 159/85 (BP Location: Right Arm)   Pulse 75   Temp 98.7 F (37.1 C) (Oral)   Resp 15   LMP 10/25/2019   SpO2 97%   Gen:   Awake, no distress    Resp:  Normal effort   MSK:   Moves extremities without difficulty   Other:      Medical Decision Making   Medically screening exam initiated at 6:39 PM.  Appropriate orders placed.  Breanna Chang was informed that the remainder of the evaluation will be completed by another provider, this initial triage assessment does not replace that evaluation, and the importance of remaining in the ED until their evaluation is complete.      Claude Manges, PA-C  10/19/21 1845    Electronically signed by Wynetta Fines, MD at 10/19/2021 11:15 PM EDT

## 2021-10-19 NOTE — ED Triage Notes (Signed)
Formatting of this note might be different from the original.  Pt here with reports of hypertension. Pt with numbness and tingling in arm.   Electronically signed by Melven Sartorius, RN at 10/19/2021  6:51 PM EDT

## 2021-10-20 ENCOUNTER — Encounter: Payer: Self-pay | Admitting: Family Medicine

## 2021-10-21 ENCOUNTER — Ambulatory Visit: Payer: 59 | Admitting: Family Medicine

## 2021-10-21 ENCOUNTER — Telehealth: Payer: Self-pay

## 2021-10-21 VITALS — BP 148/90 | HR 74 | Temp 99.3°F | Ht 64.0 in | Wt 162.8 lb

## 2021-10-21 DIAGNOSIS — Z13220 Encounter for screening for lipoid disorders: Secondary | ICD-10-CM | POA: Diagnosis not present

## 2021-10-21 DIAGNOSIS — I10 Essential (primary) hypertension: Secondary | ICD-10-CM | POA: Diagnosis not present

## 2021-10-21 DIAGNOSIS — R9439 Abnormal result of other cardiovascular function study: Secondary | ICD-10-CM | POA: Diagnosis not present

## 2021-10-21 MED ORDER — METOPROLOL TARTRATE 25 MG PO TABS: 25.0000 mg | ORAL_TABLET | Freq: Two times a day (BID) | ORAL | 3 refills | Status: DC

## 2021-10-21 MED ORDER — ROSUVASTATIN CALCIUM 20 MG PO TABS
20.0000 mg | ORAL_TABLET | Freq: Every day | ORAL | 3 refills | Status: DC
Start: 2021-10-21 — End: 2021-10-25

## 2021-10-21 MED ORDER — ASPIRIN 81 MG PO TBEC: 81.0000 mg | DELAYED_RELEASE_TABLET | Freq: Every day | ORAL | 12 refills | Status: AC

## 2021-10-21 NOTE — Patient Instructions (Signed)
Start baby aspirin 81 mg daily

## 2021-10-21 NOTE — Progress Notes (Signed)
?Megan Kelley ?DOB: 08/23/1970 ?Encounter date: 10/21/2021 ? ?This is a 51 y.o. female who presents with ?Chief Complaint  ?Patient presents with  ? Follow-up  ? ? ?History of present illness: ? ?She is here for follow up on stress testing. She has had shortness of breath with exercise and stress testing was positive:  ?1. Grade 1 diastolic dysfunction.  ? 2. Mild hypokinesis of the anteroseptum with stress concerning for  ?ischemia.  ? 3. This is a positive stress echocardiogram for ischemia (see scoring  ?diagram/findings for description), in the distribution of the left  ?anterior descending artery.  ? 4. This is an intermediate risk study.  ? ?She is not symtpomatic without exercise. She does state she is tired. Although she had some neurologic sx after stress test and went to ER, this is not something that has happened to her with exercise prior to stress testing. Was exercising nearly every day prior to stress test. She was in [place] week before (so not exercising there) - limited exercise.  ? ?Feels great in last couple of days. At home, she is still getting some higher readings. She is getting 150's/90's. Taking losartan '100mg'$  daily. ? ? ?No Known Allergies ?Current Meds  ?Medication Sig  ? Ascorbic Acid (VITAMIN C PO) Take by mouth.  ? ibuprofen (ADVIL) 200 MG tablet Take 400 mg by mouth every 6 (six) hours as needed for moderate pain.   ? losartan (COZAAR) 100 MG tablet Take 1 tablet (100 mg total) by mouth daily.  ? MAGNESIUM PO Take by mouth.  ? Multiple Vitamin (MULTIVITAMIN) tablet Take 1 tablet by mouth daily.  ? Omega-3 Fatty Acids (FISH OIL PO) Take by mouth.  ? triamcinolone cream (KENALOG) 0.1 % Apply 1 application topically 2 (two) times daily.  ? TURMERIC PO Take by mouth.  ? ? ?Review of Systems  ?Constitutional:  Negative for chills, fatigue and fever.  ?Respiratory:  Negative for cough, chest tightness, shortness of breath and wheezing.   ?Cardiovascular:  Negative for chest pain,  palpitations and leg swelling.  ? ?Objective: ? ?BP 130/70 (BP Location: Left Arm, Patient Position: Sitting, Cuff Size: Normal)   Pulse 74   Temp 99.3 ?F (37.4 ?C) (Oral)   Ht '5\' 4"'$  (1.626 m)   Wt 162 lb 12.8 oz (73.8 kg)   LMP 10/25/2019   SpO2 96%   BMI 27.94 kg/m?   Weight: 162 lb 12.8 oz (73.8 kg)  ? ?BP Readings from Last 3 Encounters:  ?10/21/21 130/70  ?10/19/21 (!) 162/95  ?09/09/21 (!) 142/90  ? ?Wt Readings from Last 3 Encounters:  ?10/21/21 162 lb 12.8 oz (73.8 kg)  ?09/09/21 160 lb 14.4 oz (73 kg)  ?07/22/21 160 lb 9.6 oz (72.8 kg)  ? ? ?Physical Exam ?Constitutional:   ?   General: She is not in acute distress. ?   Appearance: She is well-developed.  ?Cardiovascular:  ?   Rate and Rhythm: Normal rate and regular rhythm.  ?   Heart sounds: Normal heart sounds. No murmur heard. ?  No friction rub.  ?Pulmonary:  ?   Effort: Pulmonary effort is normal. No respiratory distress.  ?   Breath sounds: Normal breath sounds. No wheezing or rales.  ?Musculoskeletal:  ?   Right lower leg: No edema.  ?   Left lower leg: No edema.  ?Neurological:  ?   Mental Status: She is alert and oriented to person, place, and time.  ?Psychiatric:     ?  Behavior: Behavior normal.  ? ? ?Assessment/Plan ? ?1. Abnormal stress test ?Patient aware that test was abnormal. Refrain from exercise until seen by cardiology. Appointment was set, but patient may need to change date due to work. Understands to go to ER/call 911 if chest pain, pressure, sob. ?- Ambulatory referral to Cardiology ?- metoprolol tartrate (LOPRESSOR) 25 MG tablet; Take 1 tablet (25 mg total) by mouth 2 (two) times daily.  Dispense: 180 tablet; Refill: 3 ? ?2. Hypertension, unspecified type ?Not well controlled. Adding metoprolol '25mg'$  BID today.  ?- metoprolol tartrate (LOPRESSOR) 25 MG tablet; Take 1 tablet (25 mg total) by mouth 2 (two) times daily.  Dispense: 180 tablet; Refill: 3 ? ?3. Lipid screening ?- rosuvastatin (CRESTOR) 20 MG tablet; Take 1 tablet  (20 mg total) by mouth daily.  Dispense: 90 tablet; Refill: 3 ? ? ?Return in about 2 months (around 12/21/2021) for Chronic condition visit. ? ? ? ? ? ?Micheline Rough, MD ? ?

## 2021-10-21 NOTE — Telephone Encounter (Signed)
Noted. Will put in referral at her time of visit ?

## 2021-10-21 NOTE — Telephone Encounter (Signed)
Spoke with scheduler at [facility] to see if appt could be scheduled today for patient and no appts available next available appt 3/28 @ 2pm.  A referral is needed for appt. ?

## 2021-10-21 NOTE — Telephone Encounter (Signed)
See telephone note.

## 2021-10-24 ENCOUNTER — Other Ambulatory Visit: Payer: Self-pay | Admitting: Cardiovascular Disease

## 2021-10-24 ENCOUNTER — Other Ambulatory Visit: Payer: Self-pay

## 2021-10-24 ENCOUNTER — Ambulatory Visit: Payer: 59 | Admitting: Internal Medicine

## 2021-10-24 ENCOUNTER — Encounter: Payer: Self-pay | Admitting: Internal Medicine

## 2021-10-24 VITALS — BP 138/80 | HR 62 | Ht 64.0 in | Wt 165.0 lb

## 2021-10-24 DIAGNOSIS — R0609 Other forms of dyspnea: Secondary | ICD-10-CM

## 2021-10-24 DIAGNOSIS — I10 Essential (primary) hypertension: Secondary | ICD-10-CM | POA: Diagnosis not present

## 2021-10-24 DIAGNOSIS — R9439 Abnormal result of other cardiovascular function study: Secondary | ICD-10-CM | POA: Diagnosis not present

## 2021-10-24 NOTE — Patient Instructions (Signed)
Medication Instructions:  ?NO CHANGES  ? ?*If you need a refill on your cardiac medications before your next appointment, please call your pharmacy* ? ? ?Testing/Procedures: ?Cardiac Catheterization @ [facility] October 25, 2021 with Dr. Shelva Majestic ? ? ?Follow-Up: ?At [facility], you and your health needs are our priority.  As part of our continuing mission to provide you with exceptional heart care, we have created designated Provider Care Teams.  These Care Teams include your primary Cardiologist (physician) and Advanced Practice Providers (APPs -  Physician Assistants and Nurse Practitioners) who all work together to provide you with the care you need, when you need it. ? ?We recommend signing up for the patient portal called "MyChart".  Sign up information is provided on this After Visit Summary.  MyChart is used to connect with patients for Virtual Visits (Telemedicine).  Patients are able to view lab/test results, encounter notes, upcoming appointments, etc.  Non-urgent messages can be sent to your provider as well.   ?To learn more about what you can do with MyChart, go to [email or web address].   ? ?Your next appointment:   ? ?3-4 weeks with Dr. Debara Pickett (post-heart cath) ?-- OK to schedule on DOD day ? ?Other Instructions ? ? ?[facility] ?[address] ?[address] ?[place] [address] ?Dept: [phone] ?Loc: [phone] ? ?Megan Kelley  10/24/2021 ? ?You are scheduled for a Cardiac Catheterization on Tuesday, March 21 with Dr. Shelva Majestic. ? ?1. Please arrive at the Main Entrance A at [facility]: [place], [place] [address] at 5:30 AM (This time is two hours before your procedure to ensure your preparation). Free valet parking service is available.  ? ?Special note: Every effort is made to have your procedure done on time. Please understand that emergencies sometimes delay scheduled  procedures. ? ?2. Diet: Do not eat solid foods after midnight.  You may have clear liquids until 5 AM upon the day of the procedure. ? ?3. Labs:  NONE ? ?4. Medication instructions in preparation for your procedure: ? ?On the morning of your procedure, take Aspirin and any morning medicines NOT listed above.  You may use sips of water. ? ?5. Plan to go home the same day, you will only stay overnight if medically necessary. ?6. You MUST have a responsible adult to drive you home. ?7. An adult MUST be with you the first 24 hours after you arrive home. ?8. Bring a current list of your medications, and the last time and date medication taken. ?9. Bring ID and current insurance cards. ?10.Please wear clothes that are easy to get on and off and wear slip-on shoes. ? ?Thank you for allowing [place] to care for you! ?  -- [facility] ? ? ?

## 2021-10-24 NOTE — Progress Notes (Signed)
? ?OFFICE CONSULT NOTE ? ?Chief Complaint:  ?Abnormal stress test ? ?Primary Care Physician: ?Caren Macadam, MD ? ?HPI:  ?Megan Kelley is a 51 y.o. female who is being seen today for the evaluation of abnormal stress test at the request of [name], Steele Berg, MD. this is a pleasant 51 year old female kindly referred for evaluation and management of a recent abnormal stress test.  Past medical history significant for hypertension, dyslipidemia and remote family history of coronary disease in her aunts.  She recently had presented to her primary care provider after noting worsening shortness of breath and fatigue with exertion.  Also she has had labile blood pressure which was significantly elevated.  This is required more blood pressure medicine recently including an increase in her losartan up to 100 mg daily and she was also started on metoprolol tartrate 25 mg twice daily.  Because of the concern for coronary ischemia as a possible cause of her hypertension, she was sent for exercise stress echocardiography.  The echo demonstrated mild hypokinesis of the anteroseptum and an area at stress concerning for LAD territory ischemia.  There was a noted marked hypertensive response to exercise with peak blood pressure of 245/80.  Horizontal ST segment depressions up to 1 mm were noted in the inferior and lateral leads.  LVEF was normal at 60% baseline, however the peak exercise ejection fraction was lower at 55%.  Since this study she has not been exercising.  She had reported some left arm pain as well.  She was then referred to the emergency department and seen on October 19, 2021.  Troponin was negative.  Subsequently, she followed up with her PCP on March 17 and was referred and scheduled to see me today. ? ?PMHx:  ?Past Medical History:  ?Diagnosis Date  ? Eczema   ? Endometrial polyp   ? History of Bell's palsy   ? 2012 left side ,  resolved  ? History of hypertension   ? 08-04-2019  per pt over 10 yrs  took medication briefly , lost wt and watch diet , no more medication since  ? History of melanoma in situ   ? 06/ 2019  top of right foot excision, localized  ? IDA (iron deficiency anemia)   ? ? ?Past Surgical History:  ?Procedure Laterality Date  ? DILATATION & CURETTAGE/HYSTEROSCOPY WITH MYOSURE N/A 08/12/2019  ? Procedure: [place];  Surgeon: Princess Bruins, MD;  [facility];  Service: Gynecology;  Laterality: N/A;  Request 7:30am OR time in [place] block ?requests 45 minutes  ? ESOPHAGOGASTRODUODENOSCOPY  2012 approx.  ? ROBOTIC ASSISTED TOTAL HYSTERECTOMY WITH BILATERAL SALPINGO OOPHERECTOMY Bilateral 11/25/2019  ? Procedure: XI ROBOTIC ASSISTED TOTAL HYSTERECTOMY WITH BILATERAL SALPINGECTOMY;  Surgeon: Princess Bruins, MD;  Location: [facility];  Service: Gynecology;  Laterality: Bilateral;  ? ? ?FAMHx:  ?Family History  ?Problem Relation Age of Onset  ? Pancreatic cancer Father   ? Cancer Father   ? Breast cancer Maternal Grandmother 70  ? Cancer Maternal Grandmother   ? Diabetes Mother   ?     borderline  ? Alcohol abuse Mother   ? Arthritis Mother   ? Depression Mother   ? Hypertension Mother   ? Learning disabilities Mother   ? Alcohol abuse Maternal Grandfather   ? Eczema Brother   ? Hepatitis C Brother   ? Other Brother   ?     suicide  ? Depression Brother   ?  Drug abuse Brother   ? Early death Brother   ? Learning disabilities Brother   ? ? ?SOCHx:  ? reports that she quit smoking about 7 years ago. Her smoking use included cigarettes. She has a 2.00 pack-year smoking history. She has never used smokeless tobacco. She reports current alcohol use of about 6.0 standard drinks per week. She reports that she does not currently use drugs. ? ?ALLERGIES:  ?No Known Allergies ? ?ROS: ?Pertinent items noted in HPI and remainder of comprehensive ROS otherwise negative. ? ?HOME MEDS: ?Current Outpatient Medications on  File Prior to Visit  ?Medication Sig Dispense Refill  ? aspirin 81 MG EC tablet Take 1 tablet (81 mg total) by mouth daily. Swallow whole. 30 tablet 12  ? ibuprofen (ADVIL) 200 MG tablet Take 400 mg by mouth every 6 (six) hours as needed for moderate pain.     ? losartan (COZAAR) 100 MG tablet Take 1 tablet (100 mg total) by mouth daily. 90 tablet 1  ? metoprolol tartrate (LOPRESSOR) 25 MG tablet Take 1 tablet (25 mg total) by mouth 2 (two) times daily. 180 tablet 3  ? rosuvastatin (CRESTOR) 20 MG tablet Take 1 tablet (20 mg total) by mouth daily. 90 tablet 3  ? triamcinolone cream (KENALOG) 0.1 % Apply 1 application topically 2 (two) times daily. 30 g 5  ? Ascorbic Acid (VITAMIN C PO) Take by mouth. (Patient not taking: Reported on 10/24/2021)    ? MAGNESIUM PO Take by mouth. (Patient not taking: Reported on 10/24/2021)    ? Multiple Vitamin (MULTIVITAMIN) tablet Take 1 tablet by mouth daily. (Patient not taking: Reported on 10/24/2021)    ? Omega-3 Fatty Acids (FISH OIL PO) Take by mouth. (Patient not taking: Reported on 10/24/2021)    ? TURMERIC PO Take by mouth. (Patient not taking: Reported on 10/24/2021)    ? ?No current facility-administered medications on file prior to visit.  ? ? ?LABS/IMAGING: ?No results found for this or any previous visit (from the past 48 hour(s)). ?No results found. ? ?LIPID PANEL: ?   ?Component Value Date/Time  ? CHOL 204 (H) 07/22/2021 1550  ? TRIG 124 07/22/2021 1550  ? HDL 60 07/22/2021 1550  ? CHOLHDL 3.4 07/22/2021 1550  ? LDLCALC 121 (H) 07/22/2021 1550  ? ? ?WEIGHTS: ?Wt Readings from Last 3 Encounters:  ?10/24/21 165 lb (74.8 kg)  ?10/21/21 162 lb 12.8 oz (73.8 kg)  ?09/09/21 160 lb 14.4 oz (73 kg)  ? ? ?VITALS: ?BP 138/80   Pulse 62   Ht '5\' 4"'$  (1.626 m)   Wt 165 lb (74.8 kg)   LMP 10/25/2019   SpO2 98%   BMI 28.32 kg/m?  ? ?EXAM: ?General appearance: alert and no distress ?Neck: no carotid bruit, no JVD, and thyroid not enlarged, symmetric, no  tenderness/mass/nodules ?Lungs: clear to auscultation bilaterally ?Heart: regular rate and rhythm, S1, S2 normal, no murmur, click, rub or gallop ?Abdomen: soft, non-tender; bowel sounds normal; no masses,  no organomegaly ?Extremities: extremities normal, atraumatic, no cyanosis or edema ?Pulses: 2+ and symmetric ?Skin: Skin color, texture, turgor normal. No rashes or lesions ?Neurologic: Grossly normal ?Psych: Pleasant ? ?*Examination chaperoned by Sheral Apley, RN. ? ?EKG: ?ER EKG from 10/19/2021 personally reviewed-sinus rhythm at 74, poor R wave progression anteriorly ? ?ASSESSMENT: ?Progressive dyspnea on exertion and fatigue-possible anginal equivalent ?Abnormal stress echocardiogram with anterior wall motion abnormality suggestive of ischemia ?Marked hypertensive response to exercise ?Uncontrolled hypertension ? ?PLAN: ?1.   Megan Kelley had an  abnormal stress echocardiogram performed for uncontrolled hypertension and progressive dyspnea and fatigue.  This is suggestive of possible LAD territory ischemia with anteroseptal wall motion abnormality and decline in LVEF with exercise.  She does not seem to have a lot of traditional risk factors for coronary disease however as she had abnormal findings on her stress test, she needs a definitive coronary evaluation.  I would recommend left heart catheterization.  I discussed the risk, benefits and alternatives of cardiac catheterization with her today and she is agreeable to proceed.  I discussed the procedure in detail and she had no further questions.  We were able to schedule the procedure tomorrow with Dr. Claiborne Billings.  Plan follow-up with me afterwards. ? ?Thanks again for the kind referral. ? ?Pixie Casino, MD, [facility], FACP  ?[place]  ?Medical Director of the Advanced Lipid Disorders &  ?Cardiovascular Risk Reduction Clinic ?Diplomate of the [employer] of Clinical Lipidology ?Attending Cardiologist  ?Direct Dial: [phone]  Fax:  [phone]  ?Website:  [email or web address] ? ?Nadean Corwin Glenford Garis ?10/24/2021, 1:01 PM ?

## 2021-10-24 NOTE — H&P (View-Only) (Signed)
? ?OFFICE CONSULT NOTE ? ?Chief Complaint:  ?Abnormal stress test ? ?Primary Care Physician: ?Caren Macadam, MD ? ?HPI:  ?Megan Kelley is a 51 y.o. female who is being seen today for the evaluation of abnormal stress test at the request of [name], Steele Berg, MD. this is a pleasant 51 year old female kindly referred for evaluation and management of a recent abnormal stress test.  Past medical history significant for hypertension, dyslipidemia and remote family history of coronary disease in her aunts.  She recently had presented to her primary care provider after noting worsening shortness of breath and fatigue with exertion.  Also she has had labile blood pressure which was significantly elevated.  This is required more blood pressure medicine recently including an increase in her losartan up to 100 mg daily and she was also started on metoprolol tartrate 25 mg twice daily.  Because of the concern for coronary ischemia as a possible cause of her hypertension, she was sent for exercise stress echocardiography.  The echo demonstrated mild hypokinesis of the anteroseptum and an area at stress concerning for LAD territory ischemia.  There was a noted marked hypertensive response to exercise with peak blood pressure of 245/80.  Horizontal ST segment depressions up to 1 mm were noted in the inferior and lateral leads.  LVEF was normal at 60% baseline, however the peak exercise ejection fraction was lower at 55%.  Since this study she has not been exercising.  She had reported some left arm pain as well.  She was then referred to the emergency department and seen on October 19, 2021.  Troponin was negative.  Subsequently, she followed up with her PCP on March 17 and was referred and scheduled to see me today. ? ?PMHx:  ?Past Medical History:  ?Diagnosis Date  ? Eczema   ? Endometrial polyp   ? History of Bell's palsy   ? 2012 left side ,  resolved  ? History of hypertension   ? 08-04-2019  per pt over 10 yrs  took medication briefly , lost wt and watch diet , no more medication since  ? History of melanoma in situ   ? 06/ 2019  top of right foot excision, localized  ? IDA (iron deficiency anemia)   ? ? ?Past Surgical History:  ?Procedure Laterality Date  ? DILATATION & CURETTAGE/HYSTEROSCOPY WITH MYOSURE N/A 08/12/2019  ? Procedure: [place];  Surgeon: Princess Bruins, MD;  Location: [facility];  Service: Gynecology;  Laterality: N/A;  Request 7:30am OR time in [place] block ?requests 45 minutes  ? ESOPHAGOGASTRODUODENOSCOPY  2012 approx.  ? ROBOTIC ASSISTED TOTAL HYSTERECTOMY WITH BILATERAL SALPINGO OOPHERECTOMY Bilateral 11/25/2019  ? Procedure: XI ROBOTIC ASSISTED TOTAL HYSTERECTOMY WITH BILATERAL SALPINGECTOMY;  Surgeon: Princess Bruins, MD;  Location: [facility];  Service: Gynecology;  Laterality: Bilateral;  ? ? ?FAMHx:  ?Family History  ?Problem Relation Age of Onset  ? Pancreatic cancer Father   ? Cancer Father   ? Breast cancer Maternal Grandmother 52  ? Cancer Maternal Grandmother   ? Diabetes Mother   ?     borderline  ? Alcohol abuse Mother   ? Arthritis Mother   ? Depression Mother   ? Hypertension Mother   ? Learning disabilities Mother   ? Alcohol abuse Maternal Grandfather   ? Eczema Brother   ? Hepatitis C Brother   ? Other Brother   ?     suicide  ? Depression Brother   ?  Drug abuse Brother   ? Early death Brother   ? Learning disabilities Brother   ? ? ?SOCHx:  ? reports that she quit smoking about 7 years ago. Her smoking use included cigarettes. She has a 2.00 pack-year smoking history. She has never used smokeless tobacco. She reports current alcohol use of about 6.0 standard drinks per week. She reports that she does not currently use drugs. ? ?ALLERGIES:  ?No Known Allergies ? ?ROS: ?Pertinent items noted in HPI and remainder of comprehensive ROS otherwise negative. ? ?HOME MEDS: ?Current Outpatient Medications on  File Prior to Visit  ?Medication Sig Dispense Refill  ? aspirin 81 MG EC tablet Take 1 tablet (81 mg total) by mouth daily. Swallow whole. 30 tablet 12  ? ibuprofen (ADVIL) 200 MG tablet Take 400 mg by mouth every 6 (six) hours as needed for moderate pain.     ? losartan (COZAAR) 100 MG tablet Take 1 tablet (100 mg total) by mouth daily. 90 tablet 1  ? metoprolol tartrate (LOPRESSOR) 25 MG tablet Take 1 tablet (25 mg total) by mouth 2 (two) times daily. 180 tablet 3  ? rosuvastatin (CRESTOR) 20 MG tablet Take 1 tablet (20 mg total) by mouth daily. 90 tablet 3  ? triamcinolone cream (KENALOG) 0.1 % Apply 1 application topically 2 (two) times daily. 30 g 5  ? Ascorbic Acid (VITAMIN C PO) Take by mouth. (Patient not taking: Reported on 10/24/2021)    ? MAGNESIUM PO Take by mouth. (Patient not taking: Reported on 10/24/2021)    ? Multiple Vitamin (MULTIVITAMIN) tablet Take 1 tablet by mouth daily. (Patient not taking: Reported on 10/24/2021)    ? Omega-3 Fatty Acids (FISH OIL PO) Take by mouth. (Patient not taking: Reported on 10/24/2021)    ? TURMERIC PO Take by mouth. (Patient not taking: Reported on 10/24/2021)    ? ?No current facility-administered medications on file prior to visit.  ? ? ?LABS/IMAGING: ?No results found for this or any previous visit (from the past 48 hour(s)). ?No results found. ? ?LIPID PANEL: ?   ?Component Value Date/Time  ? CHOL 204 (H) 07/22/2021 1550  ? TRIG 124 07/22/2021 1550  ? HDL 60 07/22/2021 1550  ? CHOLHDL 3.4 07/22/2021 1550  ? LDLCALC 121 (H) 07/22/2021 1550  ? ? ?WEIGHTS: ?Wt Readings from Last 3 Encounters:  ?10/24/21 165 lb (74.8 kg)  ?10/21/21 162 lb 12.8 oz (73.8 kg)  ?09/09/21 160 lb 14.4 oz (73 kg)  ? ? ?VITALS: ?BP 138/80   Pulse 62   Ht '5\' 4"'$  (1.626 m)   Wt 165 lb (74.8 kg)   LMP 10/25/2019   SpO2 98%   BMI 28.32 kg/m?  ? ?EXAM: ?General appearance: alert and no distress ?Neck: no carotid bruit, no JVD, and thyroid not enlarged, symmetric, no  tenderness/mass/nodules ?Lungs: clear to auscultation bilaterally ?Heart: regular rate and rhythm, S1, S2 normal, no murmur, click, rub or gallop ?Abdomen: soft, non-tender; bowel sounds normal; no masses,  no organomegaly ?Extremities: extremities normal, atraumatic, no cyanosis or edema ?Pulses: 2+ and symmetric ?Skin: Skin color, texture, turgor normal. No rashes or lesions ?Neurologic: Grossly normal ?Psych: Pleasant ? ?*Examination chaperoned by Sheral Apley, RN. ? ?EKG: ?ER EKG from 10/19/2021 personally reviewed-sinus rhythm at 74, poor R wave progression anteriorly ? ?ASSESSMENT: ?Progressive dyspnea on exertion and fatigue-possible anginal equivalent ?Abnormal stress echocardiogram with anterior wall motion abnormality suggestive of ischemia ?Marked hypertensive response to exercise ?Uncontrolled hypertension ? ?PLAN: ?1.   Megan Kelley had an  abnormal stress echocardiogram performed for uncontrolled hypertension and progressive dyspnea and fatigue.  This is suggestive of possible LAD territory ischemia with anteroseptal wall motion abnormality and decline in LVEF with exercise.  She does not seem to have a lot of traditional risk factors for coronary disease however as she had abnormal findings on her stress test, she needs a definitive coronary evaluation.  I would recommend left heart catheterization.  I discussed the risk, benefits and alternatives of cardiac catheterization with her today and she is agreeable to proceed.  I discussed the procedure in detail and she had no further questions.  We were able to schedule the procedure tomorrow with Dr. Claiborne Billings.  Plan follow-up with me afterwards. ? ?Thanks again for the kind referral. ? ?Pixie Casino, MD, [facility], FACP  ?[place]  ?Medical Director of the Advanced Lipid Disorders &  ?Cardiovascular Risk Reduction Clinic ?Diplomate of the [employer] of Clinical Lipidology ?Attending Cardiologist  ?Direct Dial: [phone]  Fax:  [phone]  ?Website:  [email or web address] ? ?Nadean Corwin Joaovictor Krone ?10/24/2021, 1:01 PM ?

## 2021-10-25 ENCOUNTER — Other Ambulatory Visit (HOSPITAL_COMMUNITY): Payer: Self-pay

## 2021-10-25 ENCOUNTER — Encounter (HOSPITAL_COMMUNITY): Payer: Self-pay | Admitting: Cardiovascular Disease

## 2021-10-25 ENCOUNTER — Ambulatory Visit (HOSPITAL_COMMUNITY)
Admission: RE | Admit: 2021-10-25 | Discharge: 2021-10-25 | Disposition: A | Discharge status: Home / Self Care | Payer: 59 | Attending: Cardiovascular Disease | Admitting: Cardiovascular Disease

## 2021-10-25 ENCOUNTER — Encounter (HOSPITAL_COMMUNITY)
Admission: RE | Disposition: A | Discharge status: Home / Self Care | Payer: Self-pay | Source: Home / Self Care | Attending: Cardiovascular Disease

## 2021-10-25 ENCOUNTER — Other Ambulatory Visit: Payer: Self-pay

## 2021-10-25 DIAGNOSIS — I10 Essential (primary) hypertension: Secondary | ICD-10-CM

## 2021-10-25 DIAGNOSIS — R9439 Abnormal result of other cardiovascular function study: Secondary | ICD-10-CM | POA: Diagnosis present

## 2021-10-25 DIAGNOSIS — Z87891 Personal history of nicotine dependence: Secondary | ICD-10-CM | POA: Insufficient documentation

## 2021-10-25 DIAGNOSIS — Z13220 Encounter for screening for lipoid disorders: Secondary | ICD-10-CM

## 2021-10-25 DIAGNOSIS — I2510 Atherosclerotic heart disease of native coronary artery without angina pectoris: Secondary | ICD-10-CM | POA: Diagnosis not present

## 2021-10-25 DIAGNOSIS — E785 Hyperlipidemia, unspecified: Secondary | ICD-10-CM

## 2021-10-25 DIAGNOSIS — Z8249 Family history of ischemic heart disease and other diseases of the circulatory system: Secondary | ICD-10-CM | POA: Diagnosis not present

## 2021-10-25 DIAGNOSIS — Z79899 Other long term (current) drug therapy: Secondary | ICD-10-CM | POA: Diagnosis not present

## 2021-10-25 DIAGNOSIS — Z7982 Long term (current) use of aspirin: Secondary | ICD-10-CM | POA: Insufficient documentation

## 2021-10-25 DIAGNOSIS — Z955 Presence of coronary angioplasty implant and graft: Secondary | ICD-10-CM

## 2021-10-25 HISTORY — PX: CORONARY STENT INTERVENTION: CATH118234

## 2021-10-25 HISTORY — PX: LEFT HEART CATH AND CORONARY ANGIOGRAPHY: CATH118249

## 2021-10-25 LAB — POCT ACTIVATED CLOTTING TIME
Activated Clotting Time: 263 seconds
Activated Clotting Time: 281 seconds
Activated Clotting Time: 323 seconds

## 2021-10-25 SURGERY — LEFT HEART CATH AND CORONARY ANGIOGRAPHY
Anesthesia: LOCAL

## 2021-10-25 MED ORDER — TICAGRELOR 90 MG PO TABS
ORAL_TABLET | ORAL | Status: DC | PRN
  Administered 2021-10-25: 180 mg via ORAL

## 2021-10-25 MED ORDER — HEPARIN (PORCINE) IN NACL 1000-0.9 UT/500ML-% IV SOLN
INTRAVENOUS | Status: AC
  Filled 2021-10-25: qty 1000

## 2021-10-25 MED ORDER — HEPARIN (PORCINE) IN NACL 2-0.9 UNITS/ML
INTRAMUSCULAR | Status: DC | PRN
  Administered 2021-10-25: 10 mL via INTRA_ARTERIAL

## 2021-10-25 MED ORDER — ACETAMINOPHEN 325 MG PO TABS: 650.0000 mg | ORAL_TABLET | ORAL | Status: DC | PRN

## 2021-10-25 MED ORDER — FENTANYL CITRATE (PF) 100 MCG/2ML IJ SOLN
INTRAMUSCULAR | Status: AC
  Filled 2021-10-25: qty 2

## 2021-10-25 MED ORDER — MIDAZOLAM HCL 2 MG/2ML IJ SOLN
INTRAMUSCULAR | Status: AC
  Filled 2021-10-25: qty 2

## 2021-10-25 MED ORDER — FENTANYL CITRATE (PF) 100 MCG/2ML IJ SOLN
INTRAMUSCULAR | Status: DC | PRN
  Administered 2021-10-25 (×2): 25 ug via INTRAVENOUS

## 2021-10-25 MED ORDER — LIDOCAINE HCL (PF) 1 % IJ SOLN
INTRAMUSCULAR | Status: DC | PRN
  Administered 2021-10-25: 2 mL

## 2021-10-25 MED ORDER — TICAGRELOR 90 MG PO TABS
90.0000 mg | ORAL_TABLET | Freq: Two times a day (BID) | ORAL | 1 refills | Status: DC
  Filled 2021-10-25: qty 60, 30d supply, fill #0

## 2021-10-25 MED ORDER — HEPARIN (PORCINE) IN NACL 1000-0.9 UT/500ML-% IV SOLN
INTRAVENOUS | Status: AC
  Filled 2021-10-25: qty 500

## 2021-10-25 MED ORDER — NITROGLYCERIN 0.4 MG SL SUBL
0.4000 mg | SUBLINGUAL_TABLET | SUBLINGUAL | 2 refills | Status: DC | PRN
  Filled 2021-10-25: qty 25, 7d supply, fill #0

## 2021-10-25 MED ORDER — TICAGRELOR 90 MG PO TABS
ORAL_TABLET | ORAL | Status: AC
  Filled 2021-10-25: qty 1

## 2021-10-25 MED ORDER — VERAPAMIL HCL 2.5 MG/ML IV SOLN
INTRAVENOUS | Status: AC
  Filled 2021-10-25: qty 2

## 2021-10-25 MED ORDER — HYDRALAZINE HCL 20 MG/ML IJ SOLN: 10.0000 mg | INTRAMUSCULAR | Status: AC | PRN

## 2021-10-25 MED ORDER — HEPARIN SODIUM (PORCINE) 1000 UNIT/ML IJ SOLN
INTRAMUSCULAR | Status: AC
  Filled 2021-10-25: qty 10

## 2021-10-25 MED ORDER — SODIUM CHLORIDE 0.9 % WEIGHT BASED INFUSION: 1.0000 mL/kg/h | INTRAVENOUS | Status: DC

## 2021-10-25 MED ORDER — ASPIRIN 81 MG PO CHEW
81.0000 mg | CHEWABLE_TABLET | ORAL | Status: DC
  Filled 2021-10-25: qty 1

## 2021-10-25 MED ORDER — ROSUVASTATIN CALCIUM 40 MG PO TABS
20.0000 mg | ORAL_TABLET | Freq: Every day | ORAL | 2 refills | Status: DC
  Filled 2021-10-25: qty 30, 60d supply, fill #0

## 2021-10-25 MED ORDER — SODIUM CHLORIDE 0.9% FLUSH: 3.0000 mL | Freq: Two times a day (BID) | INTRAVENOUS | Status: DC

## 2021-10-25 MED ORDER — SODIUM CHLORIDE 0.9% FLUSH: 3.0000 mL | INTRAVENOUS | Status: DC | PRN

## 2021-10-25 MED ORDER — SODIUM CHLORIDE 0.9 % IV SOLN: INTRAVENOUS | Status: AC

## 2021-10-25 MED ORDER — ASPIRIN 81 MG PO CHEW: 81.0000 mg | CHEWABLE_TABLET | Freq: Every day | ORAL | Status: DC

## 2021-10-25 MED ORDER — TICAGRELOR 90 MG PO TABS: 90.0000 mg | ORAL_TABLET | Freq: Two times a day (BID) | ORAL | Status: DC

## 2021-10-25 MED ORDER — NITROGLYCERIN 1 MG/10 ML FOR IR/CATH LAB
INTRA_ARTERIAL | Status: DC | PRN
  Administered 2021-10-25 (×3): 200 ug via INTRACORONARY

## 2021-10-25 MED ORDER — LIDOCAINE HCL (PF) 1 % IJ SOLN
INTRAMUSCULAR | Status: AC
  Filled 2021-10-25: qty 30

## 2021-10-25 MED ORDER — MIDAZOLAM HCL 2 MG/2ML IJ SOLN
INTRAMUSCULAR | Status: DC | PRN
  Administered 2021-10-25: 2 mg via INTRAVENOUS
  Administered 2021-10-25: 1 mg via INTRAVENOUS

## 2021-10-25 MED ORDER — LABETALOL HCL 5 MG/ML IV SOLN: 10.0000 mg | INTRAVENOUS | Status: AC | PRN

## 2021-10-25 MED ORDER — HEPARIN SODIUM (PORCINE) 1000 UNIT/ML IJ SOLN
INTRAMUSCULAR | Status: DC | PRN
  Administered 2021-10-25: 3600 [IU] via INTRAVENOUS
  Administered 2021-10-25: 2000 [IU] via INTRAVENOUS
  Administered 2021-10-25: 4000 [IU] via INTRAVENOUS
  Administered 2021-10-25: 2000 [IU] via INTRAVENOUS

## 2021-10-25 MED ORDER — SODIUM CHLORIDE 0.9 % WEIGHT BASED INFUSION
3.0000 mL/kg/h | INTRAVENOUS | Status: DC
  Administered 2021-10-25: 3 mL/kg/h via INTRAVENOUS

## 2021-10-25 MED ORDER — NITROGLYCERIN 1 MG/10 ML FOR IR/CATH LAB
INTRA_ARTERIAL | Status: AC
  Filled 2021-10-25: qty 10

## 2021-10-25 MED ORDER — IOHEXOL 350 MG/ML SOLN
INTRAVENOUS | Status: DC | PRN
  Administered 2021-10-25: 160 mL via INTRA_ARTERIAL

## 2021-10-25 MED ORDER — HEPARIN (PORCINE) IN NACL 1000-0.9 UT/500ML-% IV SOLN
INTRAVENOUS | Status: DC | PRN
  Administered 2021-10-25 (×2): 500 mL

## 2021-10-25 MED ORDER — ONDANSETRON HCL 4 MG/2ML IJ SOLN: 4.0000 mg | Freq: Four times a day (QID) | INTRAMUSCULAR | Status: DC | PRN

## 2021-10-25 MED ORDER — SODIUM CHLORIDE 0.9 % IV SOLN: 250.0000 mL | INTRAVENOUS | Status: DC | PRN

## 2021-10-25 SURGICAL SUPPLY — 17 items
BALLN SAPPHIRE 2.5X12 (BALLOONS) ×2
BALLN SAPPHIRE ~~LOC~~ 3.5X12 (BALLOONS) ×1 IMPLANT
BALLOON SAPPHIRE 2.5X12 (BALLOONS) IMPLANT
CATH INFINITI JR4 5F (CATHETERS) ×1 IMPLANT
CATH OPTITORQUE TIG 4.0 5F (CATHETERS) ×1 IMPLANT
CATH VISTA GUIDE 6FR XBLAD3.5 (CATHETERS) ×1 IMPLANT
DEVICE RAD COMP TR BAND LRG (VASCULAR PRODUCTS) ×1 IMPLANT
GLIDESHEATH SLEND SS 6F .021 (SHEATH) ×1 IMPLANT
GUIDEWIRE INQWIRE 1.5J.035X260 (WIRE) IMPLANT
INQWIRE 1.5J .035X260CM (WIRE) ×2
KIT ENCORE 26 ADVANTAGE (KITS) ×1 IMPLANT
KIT HEART LEFT (KITS) ×2 IMPLANT
PACK CARDIAC CATHETERIZATION (CUSTOM PROCEDURE TRAY) ×2 IMPLANT
STENT ONYX FRONTIER 3.0X22 (Permanent Stent) ×1 IMPLANT
TRANSDUCER W/STOPCOCK (MISCELLANEOUS) ×2 IMPLANT
TUBING CIL FLEX 10 FLL-RA (TUBING) ×2 IMPLANT
WIRE COUGAR XT STRL 190CM (WIRE) ×1 IMPLANT

## 2021-10-25 NOTE — Discharge Instructions (Signed)

## 2021-10-25 NOTE — Discharge Summary (Signed)
?Discharge Summary for Same Day PCI  ? ?Patient ID: Megan Kelley ?MRN: [id number]; DOB: [birth year] ? ?Admit date: 10/25/2021 ?Discharge date: 10/25/2021 ? ?Primary Care Provider: Caren Macadam, MD  ?Primary Cardiologist: Pixie Casino, MD  ?Primary Electrophysiologist:  None  ? ?Discharge Diagnoses  ?  ?Principal Problem: ?  CAD (coronary artery disease) ?Active Problems: ?  Hyperlipidemia ?  Hypertension ? ? ?Diagnostic Studies/Procedures  ?  ?Cardiac Catheterization 10/25/2021: ? ?  Mid LAD lesion is 80% stenosed. ?  Prox LAD to Mid LAD lesion is 35% stenosed. ?  A drug-eluting stent was successfully placed. ?  Post intervention, there is a 0% residual stenosis. ?  Post intervention, there is a 0% residual stenosis. ?  ?Single-vessel coronary artery disease with 80% eccentric LAD stenosis between the first 2 diagonal vessels. ?  ?Normal left circumflex and dominant RCA. ?  ?LVEDP 19 mmHg ?  ?Successful PCI to the LAD with ultimate insertion of a 3.0 x 22 mm Resolute Onyx Frontier stent postdilated to 3.5 mm with the stenoses being reduced to 0%. ?  ?RECOMMENDATION: ?DAPT for minimum of 6 months but preferably a year.  The patient was just started on metoprolol succinate and rosuvastatin 20 mg. Target LDL less than 70, consider titration of rosuvastatin to 40 mg.  We will plan for same-day discharge later today if patient remains stable and follow-up with Dr. Debara Pickett. ? ?Diagnostic ?Dominance: Right ?Intervention ? ?  ? ?_____________ ?  ?History of Present Illness   ?  ?Megan Kelley is a 51 y.o. female with past medical history significant for hypertension, dyslipidemia and remote family history of coronary disease in her aunts.  She recently had presented to her primary care provider after noting worsening shortness of breath and fatigue with exertion.  Also she has had labile blood pressure which was significantly elevated.  This required more blood pressure medicine recently including an increase  in her losartan up to 100 mg daily and she was also started on metoprolol tartrate 25 mg twice daily.  Because of the concern for coronary ischemia as a possible cause of her hypertension, she was sent for exercise stress echocardiography.  The echo demonstrated mild hypokinesis of the anteroseptum and an area at stress concerning for LAD territory ischemia.  There was a noted marked hypertensive response to exercise with peak blood pressure of 245/80.  Horizontal ST segment depressions up to 1 mm were noted in the inferior and lateral leads. LVEF was normal at 60% baseline, however the peak exercise ejection fraction was lower at 55%.  Since that study she had not been exercising.  She had reported some left arm pain as well.  She was then referred to the emergency department and seen on October 19, 2021.  Troponin was negative.  Subsequently, she followed up with her PCP on March 17 and was referred and scheduled to see Dr. Debara Pickett.   ? ?Cardiac catheterization was arranged for further evaluation. ? ?Hospital Course  ?   ?The patient underwent cardiac cath as noted above with mLAD 80% treated with PCI/DES x1, pLAD with 40% lesion. Plan for DAPT with ASA/Brilinta for at least 6 months, likely one year. The patient was seen by cardiac rehab while in short stay. There were no observed complications post cath. Radial cath site was re-evaluated prior to discharge and found to be stable without any complications. Instructions/precautions regarding cath site care were given prior to discharge. Have asked that she increase her Crestor  from 20 to '40mg'$  daily. ? ?Megan Kelley was seen by Dr. Claiborne Billings and determined stable for discharge home. Follow up with our office has been arranged. Medications are listed below. Pertinent changes include addition of Brilinta and increase in Crestor from 20-'40mg'$  daily. . ? ?_____________ ? ?Cath/PCI Registry Performance & Quality Measures: ?Aspirin prescribed? - Yes ?ADP Receptor Inhibitor  (Plavix/Clopidogrel, Brilinta/Ticagrelor or Effient/Prasugrel) prescribed (includes medically managed patients)? - Yes ?High Intensity Statin (Lipitor 40-'80mg'$  or Crestor 20-'40mg'$ ) prescribed? - Yes ?For EF <40%, was ACEI/ARB prescribed? - Not Applicable (EF >/= 61%) ?For EF <40%, Aldosterone Antagonist (Spironolactone or Eplerenone) prescribed? - Not Applicable (EF >/= 95%) ?Cardiac Rehab Phase II ordered (Included Medically managed Patients)? - Yes ? ?_____________ ? ? ?Discharge Vitals ?Blood pressure (!) 154/79, pulse 64, temperature 98.1 ?F (36.7 ?C), temperature source Oral, resp. rate 17, height '5\' 4"'$  (1.626 m), weight 72.6 kg, last menstrual period 10/25/2019, SpO2 98 %.  Megan Kelley Weights  ? 10/25/21 0528  ?Weight: 72.6 kg  ? ? ?Last Labs & Radiologic Studies  ?  ?CBC ?No results for input(s): WBC, NEUTROABS, HGB, HCT, MCV, PLT in the last 72 hours. ?Basic Metabolic Panel ?No results for input(s): NA, K, CL, CO2, GLUCOSE, BUN, CREATININE, CALCIUM, MG, PHOS in the last 72 hours. ?Liver Function Tests ?No results for input(s): AST, ALT, ALKPHOS, BILITOT, PROT, ALBUMIN in the last 72 hours. ?No results for input(s): LIPASE, AMYLASE in the last 72 hours. ?High Sensitivity Troponin:   ?Recent Labs  ?Lab 10/19/21 ?1850  ?TROPONINIHS 13  ?  ?BNP ?Invalid input(s): POCBNP ?D-Dimer ?No results for input(s): DDIMER in the last 72 hours. ?Hemoglobin A1C ?No results for input(s): HGBA1C in the last 72 hours. ?Fasting Lipid Panel ?No results for input(s): CHOL, HDL, LDLCALC, TRIG, CHOLHDL, LDLDIRECT in the last 72 hours. ?Thyroid Function Tests ?No results for input(s): TSH, T4TOTAL, T3FREE, THYROIDAB in the last 72 hours. ? ?Invalid input(s): FREET3 ?_____________  ?DG Chest 2 View ? ?Result Date: 10/19/2021 ?CLINICAL DATA:  Chest pain, hypertension. EXAM: CHEST - 2 VIEW COMPARISON:  None. FINDINGS: The heart size and mediastinal contours are within normal limits. No consolidation, effusion, or pneumothorax. Mild  degenerative changes in the thoracic spine. IMPRESSION: No active cardiopulmonary disease. Electronically Signed   By: Brett Fairy M.D.   On: 10/19/2021 20:00  ? ?MR BRAIN WO CONTRAST ? ?Result Date: 10/19/2021 ?CLINICAL DATA:  Left arm, left leg, and left face numbness, stroke suspected EXAM: MRI HEAD WITHOUT CONTRAST TECHNIQUE: Multiplanar, multiecho pulse sequences of the brain and surrounding structures were obtained without intravenous contrast. COMPARISON:  None. FINDINGS: Brain: No restricted diffusion to suggest acute or subacute infarct. No acute hemorrhage, mass, mass effect, or midline shift. No hydrocephalus or extra-axial collection. Vascular: Normal flow voids. Skull and upper cervical spine: Normal marrow signal. Sinuses/Orbits: Mucous retention cyst in the right maxillary sinus. The paranasal sinuses are otherwise clear. The orbits are unremarkable. Other: The mastoids are well aerated. IMPRESSION: No acute intracranial process. Electronically Signed   By: Merilyn Baba M.D.   On: 10/19/2021 21:39  ? ?CARDIAC CATHETERIZATION ? ?Result Date: 10/25/2021 ?  Mid LAD lesion is 80% stenosed.   Prox LAD to Mid LAD lesion is 35% stenosed.   A drug-eluting stent was successfully placed.   Post intervention, there is a 0% residual stenosis.   Post intervention, there is a 0% residual stenosis. Single-vessel coronary artery disease with 80% eccentric LAD stenosis between the first 2 diagonal vessels. Normal left circumflex  and dominant RCA. LVEDP 19 mmHg Successful PCI to the LAD with ultimate insertion of a 3.0 x 22 mm Resolute Onyx Frontier stent postdilated to 3.5 mm with the stenoses being reduced to 0%. RECOMMENDATION: DAPT for minimum of 6 months but preferably a year.  The patient was just started on metoprolol succinate and rosuvastatin 20 mg.  Target LDL less than 70, consider titration of rosuvastatin to 40 mg.  We will plan for same-day discharge later today if patient remains stable and follow-up  with Dr. Debara Pickett.  ? ?ECHOCARDIOGRAM STRESS TEST ? ?Result Date: 10/19/2021 ?    EXERCISE STRESS ECHO REPORT    -------------------------------------------------------------------------------- Patient Name:   Megan Kelley

## 2021-10-25 NOTE — Progress Notes (Signed)
Discussed with pt stent, restrictions, diet, exercise, NTG and CRPII. Pt receptive. Will refer to [place] virtual as she travels for work.  ?[id number] ?Yves Dill CES, ACSM ?1:43 PM ?10/25/2021 ? ?

## 2021-10-25 NOTE — Interval H&P Note (Signed)
Cath Lab Visit (complete for each Cath Lab visit) ? ?Clinical Evaluation Leading to the Procedure:  ? ?ACS: No. ? ?Non-ACS:   ? ?Anginal Classification: CCS III ? ?Anti-ischemic medical therapy: Minimal Therapy (1 class of medications) ? ?Non-Invasive Test Results: Intermediate-risk stress test findings: cardiac mortality 1-3%/year ? ?Prior CABG: No previous CABG ? ? ? ? ? ?History and Physical Interval Note: ? ?10/25/2021 ?7:43 AM ? ?Megan Kelley  has presented today for surgery, with the diagnosis of abnormal stress echo.  The various methods of treatment have been discussed with the patient and family. After consideration of risks, benefits and other options for treatment, the patient has consented to  Procedure(s): ?LEFT HEART CATH AND CORONARY ANGIOGRAPHY (N/A) as a surgical intervention.  The patient's history has been reviewed, patient examined, no change in status, stable for surgery.  I have reviewed the patient's chart and labs.  Questions were answered to the patient's satisfaction.   ? ? ?Shelva Majestic ? ? ?

## 2021-10-25 NOTE — Progress Notes (Signed)
Patient was given discharge instructions. She verbalized understanding. 

## 2021-10-25 NOTE — TOC Benefit Eligibility Note (Signed)
Patient Advocate Encounter ? ?Insurance verification completed.   ? ?The patient is currently admitted and upon discharge could be taking Brilinta 90 mg. ? ?The current 30 day co-pay is, $250.00 due to a $250.00 deductible.  Will be $30.00 once deductible is met.  ? ?The patient is insured through [employer]  ? ? ? ?Lyndel Safe, CPhT ?Pharmacy Patient Advocate Specialist ?[facility] Patient Advocate Team ?Direct Number: [phone]  Fax: [phone] ? ? ? ? ? ?  ?

## 2021-10-25 NOTE — Interval H&P Note (Signed)
Formatting of this note might be different from the original.  Cath Lab Visit (complete for each Cath Lab visit)    Clinical Evaluation Leading to the Procedure:     ACS: No.    Non-ACS:      Anginal Classification: CCS III    Anti-ischemic medical therapy: Minimal Therapy (1 class of medications)    Non-Invasive Test Results: Intermediate-risk stress test findings: cardiac mortality 1-3%/year    Prior CABG: No previous CABG    History and Physical Interval Note:    10/25/2021  7:43 AM    Breanna Chang  has presented today for surgery, with the diagnosis of abnormal stress echo.  The various methods of treatment have been discussed with the patient and family. After consideration of risks, benefits and other options for treatment, the patient has consented to  Procedure(s):  LEFT HEART CATH AND CORONARY ANGIOGRAPHY (N/A) as a surgical intervention.  The patient's history has been reviewed, patient examined, no change in status, stable for surgery.  I have reviewed the patient's chart and labs.  Questions were answered to the patient's satisfaction.      Breanna Chang      Electronically signed by Lennette Bihari, MD at 10/25/2021  7:45 AM EDT    Source Note - Chrystie Nose, MD - 10/24/2021  9:45 AM EDT  Formatting of this note is different from the original.  Images from the original note were not included.      OFFICE CONSULT NOTE    Chief Complaint:   Abnormal stress test    Primary Care Physician:  Wynn Banker, MD    HPI:   Breanna Chang is a 51 y.o. female who is being seen today for the evaluation of abnormal stress test at the request of [name], [name], MD. this is a pleasant 51 year old female kindly referred for evaluation and management of a recent abnormal stress test.  Past medical history significant for hypertension, dyslipidemia and remote family history of coronary disease in her aunts.  She recently had presented to her primary care provider after noting worsening shortness of  breath and fatigue with exertion.  Also she has had labile blood pressure which was significantly elevated.  This is required more blood pressure medicine recently including an increase in her losartan up to 100 mg daily and she was also started on metoprolol tartrate 25 mg twice daily.  Because of the concern for coronary ischemia as a possible cause of her hypertension, she was sent for exercise stress echocardiography.  The echo demonstrated mild hypokinesis of the anteroseptum and an area at stress concerning for LAD territory ischemia.  There was a noted marked hypertensive response to exercise with peak blood pressure of 245/80.  Horizontal ST segment depressions up to 1 mm were noted in the inferior and lateral leads.  LVEF was normal at 60% baseline, however the peak exercise ejection fraction was lower at 55%.  Since this study she has not been exercising.  She had reported some left arm pain as well.  She was then referred to the emergency department and seen on October 19, 2021.  Troponin was negative.  Subsequently, she followed up with her PCP on March 17 and was referred and scheduled to see me today.    PMHx:   Past Medical History:   Diagnosis Date    Eczema     Endometrial polyp     History of Bell's palsy     2012  left side ,  resolved    History of hypertension     08-04-2019  per pt over 10 yrs took medication briefly , lost wt and watch diet , no more medication since    History of melanoma in situ     06/ 2019  top of right foot excision, localized    IDA (iron deficiency anemia)      Past Surgical History:   Procedure Laterality Date    DILATATION & CURETTAGE/HYSTEROSCOPY WITH MYOSURE N/A 08/12/2019    Procedure: DILATATION & CURETTAGE/HYSTEROSCOPY WITH MYOSURE;  Surgeon: Genia DelLavoie, Marie-Lyne, MD;  [facility];  Service: Gynecology;  Laterality: N/A;  [place] 7[place] [facility]  requests 45 minutes    ESOPHAGOGASTRODUODENOSCOPY  2012 approx.    ROBOTIC  ASSISTED TOTAL HYSTERECTOMY WITH BILATERAL SALPINGO OOPHERECTOMY Bilateral 11/25/2019    Procedure: XI ROBOTIC ASSISTED TOTAL HYSTERECTOMY WITH BILATERAL SALPINGECTOMY;  Surgeon: Genia DelLavoie, Marie-Lyne, MD;  [facility];  Service: Gynecology;  Laterality: Bilateral;     FAMHx:   Family History   Problem Relation Age of Onset    Pancreatic cancer Father     Cancer Father     Breast cancer Maternal Grandmother [id number]    Cancer Maternal Grandmother     Diabetes Mother         borderline    Alcohol abuse Mother     Arthritis Mother     Depression Mother     Hypertension Mother     Learning disabilities Mother     Alcohol abuse Maternal Grandfather     Eczema Brother     Hepatitis C Brother     Other Brother         suicide    Depression Brother     Drug abuse Brother     Early death Brother     Learning disabilities Brother      SOCHx:    reports that she quit smoking about 7 years ago. Her smoking use included cigarettes. She has a 2.00 pack-year smoking history. She has never used smokeless tobacco. She reports current alcohol use of about 6.0 standard drinks per week. She reports that she does not currently use drugs.    ALLERGIES:   No Known Allergies    ROS:  Pertinent items noted in HPI and remainder of comprehensive ROS otherwise negative.    HOME MEDS:  Current Outpatient Medications on File Prior to Visit   Medication Sig Dispense Refill    aspirin 81 MG EC tablet Take 1 tablet (81 mg total) by mouth daily. Swallow whole. 30 tablet 12    ibuprofen (ADVIL) 200 MG tablet Take 400 mg by mouth every 6 (six) hours as needed for moderate pain.       losartan (COZAAR) 100 MG tablet Take 1 tablet (100 mg total) by mouth daily. 90 tablet 1    metoprolol tartrate (LOPRESSOR) 25 MG tablet Take 1 tablet (25 mg total) by mouth 2 (two) times daily. 180 tablet 3    rosuvastatin (CRESTOR) 20 MG tablet Take 1 tablet (20 mg total) by mouth daily. 90 tablet 3    triamcinolone cream (KENALOG) 0.1 % Apply 1  application topically 2 (two) times daily. 30 g 5    Ascorbic Acid (VITAMIN C PO) Take by mouth. (Patient not taking: Reported on 10/24/2021)      MAGNESIUM PO Take by mouth. (Patient not taking: Reported on 10/24/2021)      Multiple  Vitamin (MULTIVITAMIN) tablet Take 1 tablet by mouth daily. (Patient not taking: Reported on 10/24/2021)      Omega-3 Fatty Acids (FISH OIL PO) Take by mouth. (Patient not taking: Reported on 10/24/2021)      TURMERIC PO Take by mouth. (Patient not taking: Reported on 10/24/2021)       No current facility-administered medications on file prior to visit.     LABS/IMAGING:  No results found for this or any previous visit (from the past 48 hour(s)).  No results found.    LIPID PANEL:    Component Value Date/Time    CHOL 204 (H) 07/22/2021 1550    TRIG 124 07/22/2021 1550    HDL 60 07/22/2021 1550    CHOLHDL 3.4 07/22/2021 1550    LDLCALC 121 (H) 07/22/2021 1550     WEIGHTS:  Wt Readings from Last 3 Encounters:   10/24/21 165 lb (74.8 kg)   10/21/21 162 lb 12.8 oz (73.8 kg)   09/09/21 160 lb 14.4 oz (73 kg)     VITALS:  BP 138/80   Pulse 62   Ht 5\' 4"  (1.626 m)   Wt 165 lb (74.8 kg)   LMP 10/25/2019   SpO2 98%   BMI 28.32 kg/m     EXAM:  General appearance: alert and no distress  Neck: no carotid bruit, no JVD, and thyroid not enlarged, symmetric, no tenderness/mass/nodules  Lungs: clear to auscultation bilaterally  Heart: regular rate and rhythm, S1, S2 normal, no murmur, click, rub or gallop  Abdomen: soft, non-tender; bowel sounds normal; no masses,  no organomegaly  Extremities: extremities normal, atraumatic, no cyanosis or edema  Pulses: 2+ and symmetric  Skin: Skin color, texture, turgor normal. No rashes or lesions  Neurologic: Grossly normal  Psych: Pleasant    *Examination chaperoned by 10/27/2019, RN.    EKG:  ER EKG from 10/19/2021 personally reviewed-sinus rhythm at 74, poor R wave progression anteriorly    ASSESSMENT:  Progressive dyspnea on exertion and fatigue-possible  anginal equivalent  Abnormal stress echocardiogram with anterior wall motion abnormality suggestive of ischemia  Marked hypertensive response to exercise  Uncontrolled hypertension    PLAN:  1.   Ms. Surprenant had an abnormal stress echocardiogram performed for uncontrolled hypertension and progressive dyspnea and fatigue.  This is suggestive of possible LAD territory ischemia with anteroseptal wall motion abnormality and decline in LVEF with exercise.  She does not seem to have a lot of traditional risk factors for coronary disease however as she had abnormal findings on her stress test, she needs a definitive coronary evaluation.  I would recommend left heart catheterization.  I discussed the risk, benefits and alternatives of cardiac catheterization with her today and she is agreeable to proceed.  I discussed the procedure in detail and she had no further questions.  We were able to schedule the procedure tomorrow with Dr. Leticia Clas.  Plan follow-up with me afterwards.    Thanks again for the kind referral.    Tresa Endo, MD, [facility]   [facility]  [facility]   Medical Director of the [facility]   [facility]  Diplomate of the American Board of Clinical Lipidology  Attending Cardiologist   Direct Dial: [phone]  Fax: [phone]   Website:  [email or web address]    [phone]  10/24/2021, 1:01 PM  Electronically signed by 10/26/2021, MD at 10/24/2021  1:10 PM EDT

## 2021-10-25 NOTE — Discharge Summary (Signed)
Formatting of this note is different from the original.  Images from the original note were not included.    Discharge Summary for Same Day PCI     Patient ID: Breanna Chang  MRN: [id number]; DOB: 06-26-1971    Admit date: 10/25/2021  Discharge date: 10/25/2021    Primary Care Provider: Wynn BankerKoberlein, Junell C, MD   Primary Cardiologist: Chrystie NoseKenneth C Hilty, MD   Primary Electrophysiologist:  None     Discharge Diagnoses     Principal Problem:    CAD (coronary artery disease)  Active Problems:    Hyperlipidemia    Hypertension    Diagnostic Studies/Procedures     Cardiac Catheterization 10/25/2021:      Mid LAD lesion is 80% stenosed.    Prox LAD to Mid LAD lesion is 35% stenosed.    A drug-eluting stent was successfully placed.    Post intervention, there is a 0% residual stenosis.    Post intervention, there is a 0% residual stenosis.    Single-vessel coronary artery disease with 80% eccentric LAD stenosis between the first 2 diagonal vessels.    Normal left circumflex and dominant RCA.    LVEDP 19 mmHg    Successful PCI to the LAD with ultimate insertion of a 3.0 x 22 mm Resolute Onyx Frontier stent postdilated to 3.5 mm with the stenoses being reduced to 0%.    RECOMMENDATION:  DAPT for minimum of 6 months but preferably a year.  The patient was just started on metoprolol succinate and rosuvastatin 20 mg. Target LDL less than 70, consider titration of rosuvastatin to 40 mg.  We will plan for same-day discharge later today if patient remains stable and follow-up with Dr. Rennis GoldenHilty.    Diagnostic  Dominance: Right  Intervention        _____________    History of Present Illness      Breanna Chang is a 51 y.o. female with past medical history significant for hypertension, dyslipidemia and remote family history of coronary disease in her aunts.  She recently had presented to her primary care provider after noting worsening shortness of breath and fatigue with exertion.  Also she has had labile blood pressure which was  significantly elevated.  This required more blood pressure medicine recently including an increase in her losartan up to 100 mg daily and she was also started on metoprolol tartrate 25 mg twice daily.  Because of the concern for coronary ischemia as a possible cause of her hypertension, she was sent for exercise stress echocardiography.  The echo demonstrated mild hypokinesis of the anteroseptum and an area at stress concerning for LAD territory ischemia.  There was a noted marked hypertensive response to exercise with peak blood pressure of 245/80.  Horizontal ST segment depressions up to 1 mm were noted in the inferior and lateral leads. LVEF was normal at 60% baseline, however the peak exercise ejection fraction was lower at 55%.  Since that study she had not been exercising.  She had reported some left arm pain as well.  She was then referred to the emergency department and seen on October 19, 2021.  Troponin was negative.  Subsequently, she followed up with her PCP on March 17 and was referred and scheduled to see Dr. Rennis GoldenHilty.      Cardiac catheterization was arranged for further evaluation.    Hospital Course     The patient underwent cardiac cath as noted above with mLAD 80% treated with PCI/DES x1, pLAD with 40%  lesion. Plan for DAPT with ASA/Brilinta for at least 6 months, likely one year. The patient was seen by cardiac rehab while in short stay. There were no observed complications post cath. Radial cath site was re-evaluated prior to discharge and found to be stable without any complications. Instructions/precautions regarding cath site care were given prior to discharge. Have asked that she increase her Crestor from 20 to 40mg  daily.    Breanna Chang was seen by Dr. Joya Salm and determined stable for discharge home. Follow up with our office has been arranged. Medications are listed below. Pertinent changes include addition of Brilinta and increase in Crestor from 20-40mg  daily.  .    _____________    Cath/PCI Registry Performance & Quality Measures:  Aspirin prescribed? - Yes  ADP Receptor Inhibitor (Plavix/Clopidogrel, Brilinta/Ticagrelor or Effient/Prasugrel) prescribed (includes medically managed patients)? - Yes  High Intensity Statin (Lipitor 40-80mg  or Crestor 20-40mg ) prescribed? - Yes  For EF <40%, was ACEI/ARB prescribed? - Not Applicable (EF >/= 40%)  For EF <40%, Aldosterone Antagonist (Spironolactone or Eplerenone) prescribed? - Not Applicable (EF >/= 40%)  Cardiac Rehab Phase II ordered (Included Medically managed Patients)? - Yes    _____________    Discharge Vitals  Blood pressure (!) 154/79, pulse 64, temperature 98.1 F (36.7 C), temperature source Oral, resp. rate 17, height 5\' 4"  (1.626 m), weight 72.6 kg, last menstrual period 10/25/2019, SpO2 98 %.   Filed Weights    10/25/21 0528   Weight: 72.6 kg     Last Labs & Radiologic Studies     CBC  No results for input(s): WBC, NEUTROABS, HGB, HCT, MCV, PLT in the last 72 hours.  Basic Metabolic Panel  No results for input(s): NA, K, CL, CO2, GLUCOSE, BUN, CREATININE, CALCIUM, MG, PHOS in the last 72 hours.  Liver Function Tests  No results for input(s): AST, ALT, ALKPHOS, BILITOT, PROT, ALBUMIN in the last 72 hours.  No results for input(s): LIPASE, AMYLASE in the last 72 hours.  High Sensitivity Troponin:    Recent Labs   Lab 10/19/21  1850   TROPONINIHS 13     BNP  Invalid input(s): POCBNP  D-Dimer  No results for input(s): DDIMER in the last 72 hours.  Hemoglobin A1C  No results for input(s): HGBA1C in the last 72 hours.  Fasting Lipid Panel  No results for input(s): CHOL, HDL, LDLCALC, TRIG, CHOLHDL, LDLDIRECT in the last 72 hours.  Thyroid Function Tests  No results for input(s): TSH, T4TOTAL, T3FREE, THYROIDAB in the last 72 hours.    Invalid input(s): FREET3  _____________   DG Chest 2 View    Result Date: 10/19/2021  CLINICAL DATA:  Chest pain, hypertension. EXAM: CHEST - 2 VIEW COMPARISON:  None. FINDINGS: The heart  size and mediastinal contours are within normal limits. No consolidation, effusion, or pneumothorax. Mild degenerative changes in the thoracic spine. IMPRESSION: No active cardiopulmonary disease. Electronically Signed   By: 10/21/21 M.D.   On: 10/19/2021 20:00     MR BRAIN WO CONTRAST    Result Date: 10/19/2021  CLINICAL DATA:  Left arm, left leg, and left face numbness, stroke suspected EXAM: MRI HEAD WITHOUT CONTRAST TECHNIQUE: Multiplanar, multiecho pulse sequences of the brain and surrounding structures were obtained without intravenous contrast. COMPARISON:  None. FINDINGS: Brain: No restricted diffusion to suggest acute or subacute infarct. No acute hemorrhage, mass, mass effect, or midline shift. No hydrocephalus or extra-axial collection. Vascular: Normal flow voids. Skull and upper cervical spine: Normal  marrow signal. Sinuses/Orbits: Mucous retention cyst in the right maxillary sinus. The paranasal sinuses are otherwise clear. The orbits are unremarkable. Other: The mastoids are well aerated. IMPRESSION: No acute intracranial process. Electronically Signed   By: Wiliam Ke M.D.   On: 10/19/2021 21:39     CARDIAC CATHETERIZATION    Result Date: 10/25/2021    Mid LAD lesion is 80% stenosed.   Prox LAD to Mid LAD lesion is 35% stenosed.   A drug-eluting stent was successfully placed.   Post intervention, there is a 0% residual stenosis.   Post intervention, there is a 0% residual stenosis. Single-vessel coronary artery disease with 80% eccentric LAD stenosis between the first 2 diagonal vessels. Normal left circumflex and dominant RCA. LVEDP 19 mmHg Successful PCI to the LAD with ultimate insertion of a 3.0 x 22 mm Resolute Onyx Frontier stent postdilated to 3.5 mm with the stenoses being reduced to 0%. RECOMMENDATION: DAPT for minimum of 6 months but preferably a year.  The patient was just started on metoprolol succinate and rosuvastatin 20 mg.  Target LDL less than 70, consider titration of  rosuvastatin to 40 mg.  We will plan for same-day discharge later today if patient remains stable and follow-up with Dr. Rennis Golden.     ECHOCARDIOGRAM STRESS TEST    Result Date: 10/19/2021      EXERCISE STRESS ECHO REPORT    -------------------------------------------------------------------------------- Patient Name:   Breanna Chang Date of Exam: 10/19/2021 Medical Rec #:  [id number]          Height:       64.0 in Accession #:    [id number]         Weight:       160.9 lb Date of Birth:  1971/03/28          BSA:          1.784 m Patient Age:    50 years           BP:           149/97 mmHg Patient Gender: F                  HR:           70 bpm. Exam Location:  [address] Procedure: Limited Echo, Stress Echo, Cardiac Doppler, Color Doppler and            Intracardiac Opacification Agent Indications:    R06.02 SOB  History:        Patient has no prior history of Echocardiogram examinations.  Sonographer:    Thurman Coyer RDCS Referring Phys: Wynn Banker IMPRESSIONS  1. Grade 1 diastolic dysfunction.  2. Mild hypokinesis of the anteroseptum with stress concerning for ischemia.  3. This is a positive stress echocardiogram for ischemia (see scoring diagram/findings for description), in the distribution of the left anterior descending artery.  4. This is an intermediate risk study. FINDINGS Exam Protocol: The patient exercised on a treadmill according to a Bruce protocol. Definity contrast agent was given IV to delineate the left ventricular endocardial borders.  Patient Performance: The patient exercised for 7 minutes and 15 seconds, achieving 8.9 METS. The maximum stage achieved was II of the Bruce protocol. The baseline heart rate was 70 bpm. The heart rate at peak stress was 164 bpm. The target heart rate was  calculated to be 143 bpm. The percentage of maximum predicted heart rate achieved was 97.4 %. The baseline blood pressure was 149/97 mmHg.  The blood pressure at peak stress was 245/80 mmHg. The  blood pressure response was hypertensive. The patient developed Hypertension during the stress exam. The patient's functional capacity was average.  EKG: Resting EKG showed normal sinus rhythm with no abnormal findings. The patient developed ST-T segment changes during exercise. There was 1.0 mm of horizontal ST segment depression in lead(s) II, III, AVF, V4, V5 and V6.  2D Echo Findings: The baseline ejection fraction was 60%. The peak ejection fraction at stress was 55%. Baseline regional wall motion abnormalities were not present. This is a positive stress echocardiogram for ischemia, in the distribution of the left anterior descending artery. This is a positive stress echocardiogram for ischemia.  Stress Doppler:  Mitral Regurgitation: At baseline, trivial mitral regurgitation was present.  Chilton Si MD Electronically signed on 10/19/2021 at 11:15:39 PM    Final       Disposition     Pt is being discharged home today in good condition.    Follow-up Plans & Appointments      Follow-up Information       Azalee Course, [place] Follow up on 11/25/2021.    Specialties: Cardiology, Radiology  Why: at 10:05am for your follow up appt with Dr. Andrez Grime' PA  Contact information:  [address]  [address]  [place] [address]  [phone]                    Discharge Instructions       AMB Referral to Cardiac Rehabilitation - Phase II   Complete by: As directed     Diagnosis: Coronary Stents    After initial evaluation and assessments completed: Virtual Based Care may be provided alone or in conjunction with Phase 2 Cardiac Rehab based on patient barriers.: Yes         Discharge Medications     Allergies as of 10/25/2021    No Known Allergies        Medication List       STOP taking these medications      ibuprofen 200 MG tablet  Commonly known as: ADVIL          TAKE these medications      aspirin 81 MG EC tablet  Take 1 tablet (81 mg total) by mouth daily. Swallow whole.    Brilinta 90 MG Tabs tablet  Generic drug:  ticagrelor  Take 1 tablet (90 mg total) by mouth 2 (two) times daily.    esomeprazole 20 MG capsule  Commonly known as: NEXIUM  Take 20 mg by mouth daily as needed (heartburn).    losartan 100 MG tablet  Commonly known as: COZAAR  Take 1 tablet (100 mg total) by mouth daily.    metoprolol tartrate 25 MG tablet  Commonly known as: LOPRESSOR  Take 1 tablet (25 mg total) by mouth 2 (two) times daily.    nitroGLYCERIN 0.4 MG SL tablet  Commonly known as: Nitrostat  Place 1 tablet (0.4 mg total) under the tongue every 5 (five) minutes as needed.    rosuvastatin 40 MG tablet  Commonly known as: Crestor  Take 0.5 tablets (20 mg total) by mouth daily.  What changed: medication strength    triamcinolone cream 0.1 %  Commonly known as: KENALOG  Apply 1 application topically 2 (two) times daily.  What changed:   when to take this  reasons to take this              Allergies  No  Known Allergies    Outstanding Labs/Studies     FLP/LFTs in 8 weeks     Duration of Discharge Encounter     Greater than 30 minutes including physician time.    Signed,  Laverda Page, NP  10/25/2021, 1:36 PM    Electronically signed by Lennette Bihari, MD at 10/27/2021  4:00 PM EDT

## 2021-10-25 NOTE — Progress Notes (Signed)
Formatting of this note might be different from the original.  Discussed with pt stent, restrictions, diet, exercise, NTG and CRPII. Pt receptive. Will refer to [place] CRPII virtual as she travels for work.   [id number]  Ethelda Chick CES, ACSM  1:43 PM  10/25/2021    Electronically signed by Elissa Lovett K at 10/25/2021  1:43 PM EDT

## 2021-10-25 NOTE — Progress Notes (Signed)
Formatting of this note might be different from the original.  Patient was given discharge instructions. She verbalized understanding.  Electronically signed by Sander Radon, RN at 10/25/2021 10:38 AM EDT

## 2021-10-25 NOTE — Unmapped (Signed)
Formatting of this note might be different from the original.  Patient Advocate [employer] verification completed.      The patient is currently admitted and upon discharge could be taking Brilinta 90 mg.    The current 30 day co-pay is, $250.00 due to a $250.00 deductible.  Will be $30.00 once deductible is met.     The patient is insured through [place], [place] Patient Advocate Specialist  [place] Patient Advocate Team  Direct Number: [phone]  Fax: [phone]      Electronically signed by Lenon Oms, CPhT at 10/25/2021 10:03 AM EDT

## 2021-10-31 ENCOUNTER — Encounter: Payer: Self-pay | Admitting: Internal Medicine

## 2021-11-01 ENCOUNTER — Other Ambulatory Visit (HOSPITAL_COMMUNITY): Payer: Self-pay

## 2021-11-01 ENCOUNTER — Ambulatory Visit: Payer: 59 | Admitting: Cardiology

## 2021-11-01 ENCOUNTER — Telehealth (HOSPITAL_COMMUNITY): Payer: Self-pay | Admitting: Pharmacist

## 2021-11-01 ENCOUNTER — Other Ambulatory Visit: Payer: Self-pay | Admitting: Cardiology

## 2021-11-01 ENCOUNTER — Ambulatory Visit: Payer: 59 | Admitting: Physician Assistant

## 2021-11-01 DIAGNOSIS — Z13220 Encounter for screening for lipoid disorders: Secondary | ICD-10-CM

## 2021-11-01 MED ORDER — ROSUVASTATIN CALCIUM 40 MG PO TABS: 40.0000 mg | ORAL_TABLET | Freq: Every day | ORAL | 2 refills | Status: DC

## 2021-11-01 NOTE — Telephone Encounter (Signed)
Transitions of Care Pharmacy  ° °Call attempted for a pharmacy transitions of care follow-up. HIPAA appropriate voicemail was left with call back information provided.  ° °Call attempt #1. Will follow-up in 2-3 days.  °  °

## 2021-11-02 ENCOUNTER — Other Ambulatory Visit (HOSPITAL_COMMUNITY): Payer: Self-pay

## 2021-11-02 ENCOUNTER — Telehealth (HOSPITAL_COMMUNITY): Payer: Self-pay

## 2021-11-02 NOTE — Telephone Encounter (Signed)
Transitions of Care Pharmacy   Call attempted for a pharmacy transitions of care follow-up. HIPAA appropriate voicemail was left with call back information provided.   Call attempt #2. Will follow-up in 2-3 days.    

## 2021-11-02 NOTE — Telephone Encounter (Signed)
Pharmacy Transitions of Care Follow-up Telephone Call ? ?Date of discharge: 3/09/07/21  ?Discharge Diagnosis: CAD ? ?How have you been since you were released from the hospital? Patient doing well since discharge but her arm is still sora and bruised since procedure. Verified patient's crestor dose was '40mg'$  (dose verified in Epic on 11/01/21). No other questions about meds at this time. ? ?Medication changes made at discharge: ?     START taking: ?Brilinta (ticagrelor)  ?nitroGLYCERIN (Nitrostat)  ?STOP taking: ?ibuprofen 200 MG tablet (ADVIL)  ? ?Medication changes verified by the patient? Yes ?  ? ?Medication Accessibility: ? ?Home Pharmacy: [facility]  ? ?Was the patient provided with refills on discharged medications? Yes  ? ?Have all prescriptions been transferred from [facility] to home pharmacy? Yes  ? ?Is the patient able to afford medications? Has insurance ?  ? ?Medication Review: ? ?TICAGRELOR (BRILINTA) ?Ticagrelor 90 mg BID initiated on 10/25/21.  ?- Discussed importance of taking medication around the same time every day, ?- Advised patient of medications to avoid (NSAIDs, aspirin maintenance doses>100 mg daily) ?- Educated that Tylenol (acetaminophen) will be the preferred analgesic to prevent risk of bleeding  ?- Emphasized importance of monitoring for signs and symptoms of bleeding (abnormal bruising, prolonged bleeding, nose bleeds, bleeding from gums, discolored urine, black tarry stools)  ?- Educated patient to notify doctor if shortness of breath or abnormal heartbeat occur ?- Advised patient to alert all providers of antiplatelet therapy prior to starting a new medication or having a procedure  ? ?Follow-up Appointments: ? ?[facility] f/u appt confirmed? Scheduled to see Dr. Eulas Post on 11/25/21 @ 10:05AM.  ? ?If their condition worsens, is the pt aware to call PCP or go to the Emergency Dept.? Yes ? ?Final Patient Assessment: ?Patient has f/u scheduled and refills at home pharmacy ? ?

## 2021-11-03 ENCOUNTER — Other Ambulatory Visit: Payer: Self-pay | Admitting: *Deleted

## 2021-11-04 ENCOUNTER — Telehealth (HOSPITAL_COMMUNITY): Payer: Self-pay

## 2021-11-04 ENCOUNTER — Telehealth: Payer: Self-pay | Admitting: Internal Medicine

## 2021-11-04 NOTE — Telephone Encounter (Signed)
Patient called and had some questions regarding her recovery process from the heart cath. She said she has tenderness and a bruise on her R arm where the IV went in for the cath. She is not having any swelling. She also notes that occasionally her hands get cold. Her R hand gets colder than her L hand but it still does get cold.  ? ?She just wanted to know if these things are normal post heart cath.  ?

## 2021-11-04 NOTE — Telephone Encounter (Signed)
Pt returned phone call and stated that she wanted to go to a cardiac rehab program in [place], [place]. Faxed cardiac rehab referral to [facility] in [place]. ?Closed referral. ?

## 2021-11-04 NOTE — Telephone Encounter (Signed)
Pt insurance is active and benefits verified through [employer] Co-pay 0, DED 0/0 met, out of pocket $4,500/$4,500 met, co-insurance 20%. no pre-authorization required. Passport, 11/04/2021_0 :24am, REF# [id number] ?  ?Will contact patient to see if she is interested in the Cardiac Rehab Program. If interested, patient will need to complete follow up appt. Once completed, patient will be contacted for scheduling upon review by the RN Navigator. ?

## 2021-11-04 NOTE — Telephone Encounter (Signed)
Attempted to call patient in regards to Cardiac Rehab - LM on VM 

## 2021-11-04 NOTE — Telephone Encounter (Signed)
Contacted patient, advised that she had her heart cath on 03/21 with Dr.Kelly. after review of notes they went in through right radial artery. Patient states the bruising is worse today, but that is to be expected- however she is noticing her hand can go numb and feel cold at times. She denies any redness, inflammation, and no pain. She is taking all of her medications as prescribed- I spoke with DOD (Dr.Berry) and nurse, who states if occluded we just see if it improves, if at all. Patient made aware to monitor the symptoms, call back if any worsening symptoms occur. Patient aware. She has follow up appointment on 04/21, she will keep this.  ?Will route to primary cards just as FYI.  ?Thanks! ?

## 2021-11-07 ENCOUNTER — Other Ambulatory Visit (HOSPITAL_COMMUNITY): Payer: Self-pay

## 2021-11-08 ENCOUNTER — Other Ambulatory Visit (HOSPITAL_COMMUNITY): Payer: Self-pay

## 2021-11-11 ENCOUNTER — Encounter: Payer: Self-pay | Admitting: Family Medicine

## 2021-11-18 ENCOUNTER — Other Ambulatory Visit (HOSPITAL_COMMUNITY): Payer: Self-pay

## 2021-11-21 ENCOUNTER — Other Ambulatory Visit (HOSPITAL_COMMUNITY): Payer: Self-pay

## 2021-11-24 NOTE — Progress Notes (Signed)
? ? ?Office Visit  ?  ?Patient Name: Megan Kelley ?Date of Encounter: 11/24/2021 ? ?Primary Care Provider:  Caren Macadam, MD ?Primary Cardiologist:  Pixie Casino, MD ?Primary Electrophysiologist: None ?Chief Complaint  ?  ?Hospital follow-up for post PCI ? ? Patient Profile: ?HTN ?HLD ?CAD s/p DES to 80% mid LAD on 3/21 ?IDA ?Melanoma in situ ?Bell's palsy ? ? Recent Studies: ?10/19/21 stress echo: EF 60%, grade 1 DD, mild hypokinesis, positive for ischemia and intermitted risk ?10/25/21 LHC: Single-vessel CAD with 80% LAD lesion, treated with DES with 0% residual stenosis, LVEDP P 19 mmHg.  Patient started on DAPT with ASA and Brilinta 90 mg for 12 months ? ?History of Present Illness  ?  ?Megan Kelley is a 51 y.o. female with PMH of HTN, HLD, IDA, CAD s/p DES to 80% mid LAD stenosis.  Patient was seen by PCP for complaint of worsening shortness of breath and fatigue with exertion. BPs were also labile and required significant titration  medications for control. She underwent stress echo on 10/19/21 that showed ischemia and elevated blood pressures of 245/80 with horizontal ST segment depression and inferior and lateral leads. Baseline EF was 60% with peak of 85% during exercise. She was seen by Dr. Debara Pickett on 3/20 for consultation and was sent for [facility] for definitive coronary evaluation. Procedure was performed by Dr. Claiborne Billings on 10/25/2021 and showed 80% mid LAD stenosis and LV EDP of 19 mmHg. Patient was treated with DES x1 to mid LAD with 0% residual stenosis. She was discharged the following day and placed on DAPT with Brilinta 90 mg twice daily and aspirin daily for 12 months. ? ?Since discharge from hospital the patient reports doing well and is compliant with her medications.  She presents alone today for follow-up. Megan Kelley did report some hypertension with systolic blood pressure running in the 140s 150s. Today her blood pressure is well controlled at 128/76. Her losartan was recently  increased to 100 mg and she is tolerating the metoprolol 12.5 twice daily without side effects. She is very eager to resume her exercise activity and previously participated in [employer]. She was advised to advance her activities as tolerated and to not resume any strenuous exercise.  We discussed the importance of medication compliance especially with DAPT and was advised to report any side effects with Brilinta whenever possible to the office.  She denies chest pain, palpitations, dyspnea, PND, orthopnea, nausea, vomiting, dizziness, syncope, edema, weight gain, or early satiety. ? ?Past Medical History  ?  ?Past Medical History:  ?Diagnosis Date  ? Eczema   ? Endometrial polyp   ? History of Bell's palsy   ? 2012 left side ,  resolved  ? History of hypertension   ? 08-04-2019  per pt over 10 yrs took medication briefly , lost wt and watch diet , no more medication since  ? History of melanoma in situ   ? 06/ 2019  top of right foot excision, localized  ? IDA (iron deficiency anemia)   ? ? ?Allergies ? ?No Known Allergies ? ?Home Medications  ?  ?Current Outpatient Medications  ?Medication Sig Dispense Refill  ? aspirin 81 MG EC tablet Take 1 tablet (81 mg total) by mouth daily. Swallow whole. 30 tablet 12  ? esomeprazole (NEXIUM) 20 MG capsule Take 20 mg by mouth daily as needed (heartburn).    ? losartan (COZAAR) 100 MG tablet Take 1 tablet (100 mg total) by mouth daily.  90 tablet 1  ? metoprolol tartrate (LOPRESSOR) 25 MG tablet Take 1 tablet (25 mg total) by mouth 2 (two) times daily. 180 tablet 3  ? nitroGLYCERIN (NITROSTAT) 0.4 MG SL tablet Place 1 tablet (0.4 mg total) under the tongue every 5 (five) minutes as needed. 25 tablet 2  ? rosuvastatin (CRESTOR) 40 MG tablet Take 1 tablet (40 mg total) by mouth daily. 30 tablet 2  ? ticagrelor (BRILINTA) 90 MG TABS tablet Take 1 tablet (90 mg total) by mouth 2 (two) times daily. 180 tablet 1  ? triamcinolone cream (KENALOG) 0.1 % Apply 1 application  topically 2 (two) times daily. (Patient taking differently: Apply 1 application. topically 2 (two) times daily as needed (dry skin).) 30 g 5  ? ?No current facility-administered medications for this visit.  ?  ?Review of Systems  ?Please see the history of present illness.    ? ?All other systems reviewed and are otherwise negative except as noted above. ? ?Physical Exam  ?  ?Wt Readings from Last 3 Encounters:  ?10/25/21 160 lb (72.6 kg)  ?10/24/21 165 lb (74.8 kg)  ?10/21/21 162 lb 12.8 oz (73.8 kg)  ? ?XL:KGMWN were no vitals filed for this visit.,There is no height or weight on file to calculate BMI. ? ?Constitutional:   ?   Appearance: Healthy appearance. Not in distress.  ?Neck:  ?   Vascular: JVD normal.  ?Pulmonary:  ?   Effort: Pulmonary effort is normal.  ?   Breath sounds: No wheezing. No rales.  ?Cardiovascular:  ?   Normal rate. Regular rhythm. Normal S1. Normal S2.   ?   Murmurs: There is no murmur.  ?Edema: ?   Peripheral edema absent.  ?Abdominal:  ?   Palpations: Abdomen is soft. There is no hepatomegaly.  ?Skin: ?   General: Skin is warm and dry.  ?Neurological:  ?   General: No focal deficit present.  ?   Mental Status: Alert and oriented to person, place and time.  ?   Cranial Nerves: Cranial nerves are intact.  ?EKG/LABS/Other Studies Reviewed  ?  ?ECG personally reviewed by me today -sinus bradycardia with rate of 58- no acute changes. ? ?Lab Results  ?Component Value Date  ? WBC 6.9 10/19/2021  ? HGB 12.6 10/19/2021  ? HCT 37.1 10/19/2021  ? MCV 87.5 10/19/2021  ? PLT 292 10/19/2021  ? ?Lab Results  ?Component Value Date  ? CREATININE 0.85 10/19/2021  ? BUN 16 10/19/2021  ? NA 140 10/19/2021  ? K 3.5 10/19/2021  ? CL 103 10/19/2021  ? CO2 24 10/19/2021  ? ?Lab Results  ?Component Value Date  ? ALT 36 10/19/2021  ? AST 24 10/19/2021  ? ALKPHOS 60 10/19/2021  ? BILITOT 0.4 10/19/2021  ? ?Lab Results  ?Component Value Date  ? CHOL 204 (H) 07/22/2021  ? HDL 60 07/22/2021  ? LDLCALC 121 (H)  07/22/2021  ? TRIG 124 07/22/2021  ? CHOLHDL 3.4 07/22/2021  ?  ?No results found for: HGBA1C ? ?Assessment & Plan  ?  ?1.  Coronary artery disease: ?-s/p DES to 80% mid LAD on 3/21 ?-Current GDMT consist of ASA 81 mg daily, metoprolol 25 mg daily, rosuvastatin 40 mg daily ?-Continue DAPT with 81 mg ASA and Brilinta 90 mg twice daily ?-Stable with no anginal symptoms. No indication for ischemic evaluation.   ?-Patient is ready for cardiac rehab however she has a schedule conflict and has decided to exercise on her home.  She  was advised to avoid strenuous exercise and to advance activity as tolerated. ?-PCI site free of any signs symptom of infection or hematoma ?-She will document blood pressures twice a day and during exercise for the next 3 months. ? ?2.  Essential hypertension: ?-Patient's blood pressure today was 128/76 ?-Continue metoprolol 25 mg daily and losartan 100 mg daily ?-Continue low-sodium heart healthy diet and salty 6 she provided for patient reference ? ?3.  Hyperlipidemia: ?-Patient's last LDL cholesterol was 121 on 12/22 ?-Continue rosuvastatin 40 mg daily ?-Patient advised to eat heart healthy low-sodium diet ? ?Disposition: Follow-up with Pixie Casino, MD 3 months ?   ?Medication Adjustments/Labs and Tests Ordered: ?Current medicines are reviewed at length with the patient today.  Concerns regarding medicines are outlined above.  ?Tests Ordered: ?No orders of the defined types were placed in this encounter. ? ?Medication Changes: ?No orders of the defined types were placed in this encounter. ? ?Signed, ?Mable Fill, Marissa Nestle, NP ?11/24/2021, 8:03 AM ?[place] ?

## 2021-11-25 ENCOUNTER — Ambulatory Visit: Payer: 59 | Admitting: Nurse Practitioner

## 2021-11-25 ENCOUNTER — Encounter: Payer: Self-pay | Admitting: Nurse Practitioner

## 2021-11-25 ENCOUNTER — Other Ambulatory Visit: Payer: Self-pay

## 2021-11-25 VITALS — BP 128/76 | HR 58 | Ht 64.0 in | Wt 164.0 lb

## 2021-11-25 DIAGNOSIS — I10 Essential (primary) hypertension: Secondary | ICD-10-CM | POA: Diagnosis not present

## 2021-11-25 DIAGNOSIS — Z955 Presence of coronary angioplasty implant and graft: Secondary | ICD-10-CM | POA: Diagnosis not present

## 2021-11-25 DIAGNOSIS — E785 Hyperlipidemia, unspecified: Secondary | ICD-10-CM

## 2021-11-25 MED ORDER — LOSARTAN POTASSIUM 100 MG PO TABS: 100.0000 mg | ORAL_TABLET | Freq: Every day | ORAL | 3 refills | Status: DC

## 2021-11-25 NOTE — Patient Instructions (Signed)
Medication Instructions:  ?Your physician recommends that you continue on your current medications as directed. Please refer to the Current Medication list given to you today. ? ?*If you need a refill on your cardiac medications before your next appointment, please call your pharmacy* ? ? ?Lab Work: ?Your physician recommends that you return for lab work in 2-3 weeks:  ?Fasting Lipid Panel-DO NOT EAT OR DRINK PAST MIDNIGHT. OKAY TO HAVE WATER.  ?Hepatic (Liver) Function Test  ? ?If you have labs (blood work) drawn today and your tests are completely normal, you will receive your results only by: ?MyChart Message (if you have MyChart) OR ?A paper copy in the mail ?If you have any lab test that is abnormal or we need to change your treatment, we will call you to review the results. ? ?Testing/Procedures: ?NONE ordered at this time of appointment  ? ?Follow-Up: ?At [facility], you and your health needs are our priority.  As part of our continuing mission to provide you with exceptional heart care, we have created designated Provider Care Teams.  These Care Teams include your primary Cardiologist (physician) and Advanced Practice Providers (APPs -  Physician Assistants and Nurse Practitioners) who all work together to provide you with the care you need, when you need it. ? ?We recommend signing up for the patient portal called "MyChart".  Sign up information is provided on this After Visit Summary.  MyChart is used to connect with patients for Virtual Visits (Telemedicine).  Patients are able to view lab/test results, encounter notes, upcoming appointments, etc.  Non-urgent messages can be sent to your provider as well.   ?To learn more about what you can do with MyChart, go to [email or web address].   ? ?Your next appointment:   ?3 month(s) ? ?The format for your next appointment:   ?In Person ? ?Provider:   ?Pixie Casino, MD   ? ?Other Instructions ?NO extreme exercising. Mild exercises like walking light  weight lifting and light cardio as tolerated.  ?Monitor blood pressure at home.  ?Important Information About Sugar ? ? ? ? ? ? ?

## 2021-11-29 ENCOUNTER — Telehealth: Payer: Self-pay | Admitting: Internal Medicine

## 2021-11-29 NOTE — Telephone Encounter (Signed)
? ?  Patient Name: Megan Kelley  ?DOB: 02/08/1971 ?MRN: [id number] ? ?Primary Cardiologist: Pixie Casino, MD ? ?Chart reviewed as part of pre-operative protocol coverage.  ? ?Chart reviewed as part of pre-operative protocol coverage. Cataract cleaning are recognized in guidelines as low risk surgeries that do not typically require specific preoperative testing or holding of blood thinner therapy. Therefore, given past medical history and time since last visit, based on ACC/AHA guidelines, Megan Kelley would be at acceptable risk for the planned procedure without further cardiovascular testing.  ? ?SBE prophylaxis is not required for the patient from a cardiac standpoint. ? ?I will route this recommendation to the requesting party via Epic fax function and remove from pre-op pool. ? ?Please call with questions. ? ?Leanor Kail, PA ?11/29/2021, 4:43 PM  ?

## 2021-11-29 NOTE — Telephone Encounter (Signed)
What dental office are you calling from? Dr. Purnell Shoemaker  ? ?What is your office phone number? [phone]  ? ?What is your fax number?[phone] ? ?What type of procedure is the patient having performed? Cleaning  ? ?What date is procedure scheduled or is the patient there now? 12/23/21  ?  ?What is your question (ex. Antibiotics prior to procedure, holding medication-we need to know how long dentist wants pt to hold med)? Pre-antibiotics prior to dental cleaning.   ? ? ?(If the patient is currently at the dentist's office, call Pre-Op APP to address. If the patient is not currently in the dentist office, please route to the Pre-Op pool)  ?

## 2021-11-30 ENCOUNTER — Encounter: Payer: Self-pay | Admitting: Family Medicine

## 2021-12-23 ENCOUNTER — Telehealth: Payer: 59 | Admitting: Adult Health

## 2021-12-23 ENCOUNTER — Telehealth: Payer: 59 | Admitting: Family Medicine

## 2021-12-23 LAB — LIPID PANEL
Chol/HDL Ratio: 2 ratio (ref 0.0–4.4)
Cholesterol, Total: 114 mg/dL (ref 100–199)
HDL: 57 mg/dL (ref >39)
LDL Chol Calc (NIH): 42 mg/dL (ref 0–99)
Triglycerides: 75 mg/dL (ref 0–149)
VLDL Cholesterol Cal: 15 mg/dL (ref 5–40)

## 2021-12-23 LAB — HEPATIC FUNCTION PANEL
ALT: 31 IU/L (ref 0–32)
AST: 19 IU/L (ref 0–40)
Albumin: 4.8 g/dL (ref 3.8–4.9)
Alkaline Phosphatase: 75 IU/L (ref 44–121)
Bilirubin Total: 0.6 mg/dL (ref 0.0–1.2)
Bilirubin, Direct: 0.2 mg/dL (ref 0.00–0.40)
Total Protein: 7.1 g/dL (ref 6.0–8.5)

## 2022-01-25 ENCOUNTER — Other Ambulatory Visit: Payer: Self-pay | Admitting: Cardiology

## 2022-01-25 DIAGNOSIS — Z13220 Encounter for screening for lipoid disorders: Secondary | ICD-10-CM

## 2022-04-24 ENCOUNTER — Other Ambulatory Visit: Payer: Self-pay | Admitting: Cardiology

## 2022-04-24 ENCOUNTER — Telehealth: Payer: Self-pay | Admitting: Internal Medicine

## 2022-04-24 NOTE — Telephone Encounter (Signed)
*  STAT* If patient is at the pharmacy, call can be transferred to refill team.   1. Which medications need to be refilled? (please list name of each medication and dose if known)  ticagrelor (BRILINTA) 90 MG TABS tablet  2. Which pharmacy/location (including street and city if local pharmacy) is medication to be sent to? [id number]- [place], [place] - [address]  3. Do they need a 30 day or 90 day supply?  90 day supply + 3 refills

## 2022-04-25 MED ORDER — TICAGRELOR 90 MG PO TABS: 90.0000 mg | ORAL_TABLET | Freq: Two times a day (BID) | ORAL | 2 refills | Status: DC

## 2022-05-01 ENCOUNTER — Encounter: Payer: Self-pay | Admitting: Physician Assistant

## 2022-05-01 ENCOUNTER — Ambulatory Visit: Payer: 59 | Attending: Physician Assistant | Admitting: Physician Assistant

## 2022-05-01 VITALS — BP 136/82 | HR 50 | Ht 64.0 in | Wt 170.6 lb

## 2022-05-01 DIAGNOSIS — I10 Essential (primary) hypertension: Secondary | ICD-10-CM

## 2022-05-01 DIAGNOSIS — I2510 Atherosclerotic heart disease of native coronary artery without angina pectoris: Secondary | ICD-10-CM

## 2022-05-01 DIAGNOSIS — E785 Hyperlipidemia, unspecified: Secondary | ICD-10-CM

## 2022-05-01 NOTE — Progress Notes (Unsigned)
Cardiology Office Note:    Date:  05/02/2022   ID:  Megan Kelley, DOB [birth year], MRN [id number]  PCP:  Caren Macadam, MD (Inactive)   [place] Providers Cardiologist:  Pixie Casino, MD     Referring MD: No ref. provider found   Chief Complaint  Patient presents with   Follow-up    Seen for Dr. Debara Pickett    History of Present Illness:    Megan Kelley is a 51 y.o. female with a hx of hypertension, hyperlipidemia, and CAD.  Patient was initially referred to cardiology service due to shortness of breath with exertion and fatigue.  She also had labile blood pressure as well.  She underwent stress echo.  Echocardiogram demonstrated mild hypokinesis of the anterior septum and the stress test was concerning for LAD territory ischemia.  Patient was markedly hypertensive with systolic blood pressure over 200 at the time.  There was horizontal ST segment depression up to 1 mm noted in the inferior and lateral leads.  EF was 60%.  Patient ultimately underwent cardiac catheterization on 10/25/2021 which demonstrated 35% proximal LAD lesion followed by 80% mid LAD lesion, both area were treated with a single 3.0 x 22 mm Resolute Onyx frontier stent.  Postprocedure, patient had no residual disease.  Crestor was uptitrated to 40 mg daily.  Patient was discharged on aspirin and Plavix.  Since discharge, patient has been seen by Ambrose Pancoast NP on 11/25/2021 at which time she was doing well.  Patient presents today for follow-up.  Overall she is doing quite well and denies any chest pain or worsening dyspnea.  When she went on vacation, she did miss some night time dose of Brilinta, however this was quite rare.  Repeat blood work in May showed a very well-controlled cholesterol.  She does have some fatigue, some of this may be due to metoprolol, however her systolic blood pressure this morning was in the 130s, therefore I am hesitant to cut back on metoprolol.  I rechecked her blood  pressure again and it was 146/80 after talking with her.  She has not been checking her blood pressure at home recently.  I asked her to monitor her blood pressure in the next 2 weeks and let us know if systolic blood pressure remain elevated.  If so, I likely will switch her losartan to irbesartan 150 mg daily.  Otherwise, she can follow-up in 6 months.   Past Medical History:  Diagnosis Date   Eczema    Endometrial polyp    History of Bell's palsy    2012 left side ,  resolved   History of hypertension    08-04-2019  per pt over 10 yrs took medication briefly , lost wt and watch diet , no more medication since   History of melanoma in situ    06/ 2019  top of right foot excision, localized   IDA (iron deficiency anemia)     Past Surgical History:  Procedure Laterality Date   CORONARY STENT INTERVENTION N/A 10/25/2021   Procedure: CORONARY STENT INTERVENTION;  Surgeon: Troy Sine, MD;  Location: [facility];  Service: Cardiovascular;  Laterality: N/A;   DILATATION & CURETTAGE/HYSTEROSCOPY WITH MYOSURE N/A 08/12/2019   Procedure: DILATATION & CURETTAGE/HYSTEROSCOPY WITH MYOSURE;  Surgeon: Princess Bruins, MD;  Location: [facility];  Service: Gynecology;  Laterality: N/A;  Request 7:30am OR time in [place] [facility] block requests 45 minutes   ESOPHAGOGASTRODUODENOSCOPY  2012 approx.  LEFT HEART CATH AND CORONARY ANGIOGRAPHY N/A 10/25/2021   Procedure: LEFT HEART CATH AND CORONARY ANGIOGRAPHY;  Surgeon: Troy Sine, MD;  Location: [facility];  Service: Cardiovascular;  Laterality: N/A;   ROBOTIC ASSISTED TOTAL HYSTERECTOMY WITH BILATERAL SALPINGO OOPHERECTOMY Bilateral 11/25/2019   Procedure: XI ROBOTIC ASSISTED TOTAL HYSTERECTOMY WITH BILATERAL SALPINGECTOMY;  Surgeon: Princess Bruins, MD;  Location: [facility];  Service: Gynecology;  Laterality: Bilateral;    Current Medications: Current Meds  Medication Sig   aspirin 81 MG EC  tablet Take 1 tablet (81 mg total) by mouth daily. Swallow whole.   esomeprazole (NEXIUM) 20 MG capsule Take 20 mg by mouth daily as needed (heartburn).   losartan (COZAAR) 100 MG tablet Take 1 tablet (100 mg total) by mouth daily.   metoprolol tartrate (LOPRESSOR) 25 MG tablet Take 1 tablet (25 mg total) by mouth 2 (two) times daily.   nitroGLYCERIN (NITROSTAT) 0.4 MG SL tablet Place 1 tablet (0.4 mg total) under the tongue every 5 (five) minutes as needed.   rosuvastatin (CRESTOR) 40 MG tablet TAKE 1 TABLET BY MOUTH EVERY DAY   ticagrelor (BRILINTA) 90 MG TABS tablet Take 1 tablet (90 mg total) by mouth 2 (two) times daily. as directed   triamcinolone cream (KENALOG) 0.1 % Apply 1 application topically 2 (two) times daily. (Patient taking differently: Apply 1 application  topically 2 (two) times daily as needed (dry skin).)     Allergies:   Patient has no known allergies.   Social History   Socioeconomic History   Marital status: Divorced    Spouse name: Not on file   Number of children: Not on file   Years of education: Not on file   Highest education level: Associate degree: occupational, Hotel manager, or vocational program  Occupational History   Not on file  Tobacco Use   Smoking status: Former    Packs/day: 0.10    Years: 20.00    Total pack years: 2.00    Types: Cigarettes    Quit date: 08/03/2014    Years since quitting: 7.7   Smokeless tobacco: Never  Vaping Use   Vaping Use: Never used  Substance and Sexual Activity   Alcohol use: Yes    Alcohol/week: 6.0 standard drinks of alcohol    Types: 3 Glasses of wine, 3 Cans of beer per week    Comment: ocaasionally   Drug use: Not Currently   Sexual activity: Not Currently    Partners: Female    Birth control/protection: Pill  Other Topics Concern   Not on file  Social History Narrative   Not on file   Social Determinants of Health   Financial Resource Strain: Low Risk  (10/21/2021)   Overall Financial Resource  Strain (CARDIA)    Difficulty of Paying Living Expenses: Not hard at all  Food Insecurity: No Food Insecurity (10/21/2021)   Hunger Vital Sign    Worried About Running Out of Food in the Last Year: Never true    Ran Out of Food in the Last Year: Never true  Transportation Needs: No Transportation Needs (10/21/2021)   PRAPARE - Hydrologist (Medical): No    Lack of Transportation (Non-Medical): No  Physical Activity: Sufficiently Active (10/21/2021)   Exercise Vital Sign    Days of Exercise per Week: 5 days    Minutes of Exercise per Session: 30 min  Stress: No Stress Concern Present (10/21/2021)   [place] -  Occupational Stress Questionnaire    Feeling of Stress : Only a little  Social Connections: Moderately Isolated (10/21/2021)   Social Connection and Isolation Panel [NHANES]    Frequency of Communication with Friends and Family: More than three times a week    Frequency of Social Gatherings with Friends and Family: Once a week    Attends Religious Services: Never    Marine scientist or Organizations: Yes    Attends Music therapist: More than 4 times per year    Marital Status: Divorced     Family History: The patient's family history includes Alcohol abuse in her maternal grandfather and mother; Arthritis in her mother; Breast cancer (age of onset: 38) in her maternal grandmother; Cancer in her father and maternal grandmother; Depression in her brother and mother; Diabetes in her mother; Drug abuse in her brother; Early death in her brother; Eczema in her brother; Hepatitis C in her brother; Hypertension in her mother; Learning disabilities in her brother and mother; Other in her brother; Pancreatic cancer in her father.  ROS:   Please see the history of present illness.     All other systems reviewed and are negative.  EKGs/Labs/Other Studies Reviewed:    The following studies were reviewed  today:  Cath 10/25/2021   Mid LAD lesion is 80% stenosed.   Prox LAD to Mid LAD lesion is 35% stenosed.   A drug-eluting stent was successfully placed.   Post intervention, there is a 0% residual stenosis.   Post intervention, there is a 0% residual stenosis.   Single-vessel coronary artery disease with 80% eccentric LAD stenosis between the first 2 diagonal vessels.   Normal left circumflex and dominant RCA.   LVEDP 19 mmHg   Successful PCI to the LAD with ultimate insertion of a 3.0 x 22 mm Resolute Onyx Frontier stent postdilated to 3.5 mm with the stenoses being reduced to 0%.   RECOMMENDATION: DAPT for minimum of 6 months but preferably a year.  The patient was just started on metoprolol succinate and rosuvastatin 20 mg.  Target LDL less than 70, consider titration of rosuvastatin to 40 mg.  We will plan for same-day discharge later today if patient remains stable and follow-up with Dr. Debara Pickett.   EKG:  EKG is not ordered today.    Recent Labs: 07/22/2021: TSH 2.88 10/19/2021: BUN 16; Creatinine, Ser 0.85; Hemoglobin 12.6; Platelets 292; Potassium 3.5; Sodium 140 12/23/2021: ALT 31  Recent Lipid Panel    Component Value Date/Time   CHOL 114 12/23/2021 0915   TRIG 75 12/23/2021 0915   HDL 57 12/23/2021 0915   CHOLHDL 2.0 12/23/2021 0915   CHOLHDL 3.4 07/22/2021 1550   LDLCALC 42 12/23/2021 0915   LDLCALC 121 (H) 07/22/2021 1550     Risk Assessment/Calculations:           Physical Exam:    VS:  BP 136/82   Pulse (!) 50   Ht '5\' 4"'$  (1.626 m)   Wt 170 lb 9.6 oz (77.4 kg)   LMP 10/25/2019   SpO2 98%   BMI 29.28 kg/m         Wt Readings from Last 3 Encounters:  05/01/22 170 lb 9.6 oz (77.4 kg)  11/25/21 164 lb (74.4 kg)  10/25/21 160 lb (72.6 kg)     GEN:  Well nourished, well developed in no acute distress HEENT: Normal NECK: No JVD; No carotid bruits LYMPHATICS: No lymphadenopathy CARDIAC: RRR, no murmurs, rubs, gallops RESPIRATORY:  Clear to  auscultation without rales, wheezing or rhonchi  ABDOMEN: Soft, non-tender, non-distended MUSCULOSKELETAL:  No edema; No deformity  SKIN: Warm and dry NEUROLOGIC:  Alert and oriented x 3 PSYCHIATRIC:  Normal affect   ASSESSMENT:    1. Coronary artery disease involving native coronary artery of native heart without angina pectoris   2. Hypertension, unspecified type   3. Hyperlipidemia LDL goal <70    PLAN:    In order of problems listed above:  CAD: Patient denies any recent chest discomfort.  Patient underwent DES to proximal LAD in March 2023.  Continue aspirin and Brilinta.  Hypertension: Blood pressure is elevated today.  Her systolic blood pressure was in the 130s this morning.  I asked her to monitor her blood pressure in the next 2 weeks and let us know if her systolic blood pressure remain elevated, if so I likely will switch her losartan to irbesartan 150 mg daily  Hyperlipidemia: On Crestor.           Medication Adjustments/Labs and Tests Ordered: Current medicines are reviewed at length with the patient today.  Concerns regarding medicines are outlined above.  No orders of the defined types were placed in this encounter.  No orders of the defined types were placed in this encounter.   Patient Instructions  Medication Instructions:  Your physician recommends that you continue on your current medications as directed. Please refer to the Current Medication list given to you today.  *If you need a refill on your cardiac medications before your next appointment, please call your pharmacy*   Lab Work: NONE If you have labs (blood work) drawn today and your tests are completely normal, you will receive your results only by: [place] (if you have MyChart) OR A paper copy in the mail If you have any lab test that is abnormal or we need to change your treatment, we will call you to review the results.   Testing/Procedures: NONE   Follow-Up: At [facility], you and your health needs are our priority.  As part of our continuing mission to provide you with exceptional heart care, we have created designated Provider Care Teams.  These Care Teams include your primary Cardiologist (physician) and Advanced Practice Providers (APPs -  Physician Assistants and Nurse Practitioners) who all work together to provide you with the care you need, when you need it.  We recommend signing up for the patient portal called "MyChart".  Sign up information is provided on this After Visit Summary.  MyChart is used to connect with patients for Virtual Visits (Telemedicine).  Patients are able to view lab/test results, encounter notes, upcoming appointments, etc.  Non-urgent messages can be sent to your provider as well.   To learn more about what you can do with MyChart, go to [email or web address].    Your next appointment:   6 month(s)  The format for your next appointment:   In Person  Provider:   Pixie Casino, MD      Signed, Almyra Deforest, [place]  05/02/2022 11:34 PM    [place]

## 2022-05-01 NOTE — Patient Instructions (Signed)
Medication Instructions:  Your physician recommends that you continue on your current medications as directed. Please refer to the Current Medication list given to you today.  *If you need a refill on your cardiac medications before your next appointment, please call your pharmacy*   Lab Work: NONE If you have labs (blood work) drawn today and your tests are completely normal, you will receive your results only by: [place] (if you have MyChart) OR A paper copy in the mail If you have any lab test that is abnormal or we need to change your treatment, we will call you to review the results.   Testing/Procedures: NONE   Follow-Up: At [facility], you and your health needs are our priority.  As part of our continuing mission to provide you with exceptional heart care, we have created designated Provider Care Teams.  These Care Teams include your primary Cardiologist (physician) and Advanced Practice Providers (APPs -  Physician Assistants and Nurse Practitioners) who all work together to provide you with the care you need, when you need it.  We recommend signing up for the patient portal called "MyChart".  Sign up information is provided on this After Visit Summary.  MyChart is used to connect with patients for Virtual Visits (Telemedicine).  Patients are able to view lab/test results, encounter notes, upcoming appointments, etc.  Non-urgent messages can be sent to your provider as well.   To learn more about what you can do with MyChart, go to [email or web address].    Your next appointment:   6 month(s)  The format for your next appointment:   In Person  Provider:   Pixie Casino, MD

## 2022-05-02 ENCOUNTER — Encounter: Payer: Self-pay | Admitting: Physician Assistant

## 2022-05-09 ENCOUNTER — Telehealth: Payer: Self-pay | Admitting: Internal Medicine

## 2022-05-09 NOTE — Telephone Encounter (Signed)
LMTCB

## 2022-05-09 NOTE — Telephone Encounter (Signed)
Patient reported the following Bps: Blood pressure reads requested per Dr HOA BP 9/29 134/76 HR 64 '@1330'$  10/2 140/78. HR 59 @ 1117 10/3 151/75. HR 66 @ 0930 (taken by nurse at hospital this am) This is always high. I am on 100 mg of losartan for the past 7 months. I think it's time to try something else   Patient is asymptomatic. Please advise on medication.

## 2022-05-09 NOTE — Telephone Encounter (Signed)
  Per MyChart scheduling message:  Blood pressure reads requested per Dr HOA BP 9/29 134/76 HR 64 '@1330'$  10/2 140/78. HR 59 @ 1117 10/3 151/75. HR 66 @ 0930 This is always high. I am on 100 mg of losartan for the past 7 months. I think it's time to try something else

## 2022-05-09 NOTE — Telephone Encounter (Signed)
Formatting of this note is different from the original.    Per MyChart scheduling message:    Blood pressure reads requested per Dr HOA  BP  9/29 134/76 HR 64 @1330   10/2 140/78. HR 59 @ 1117  10/3 151/75. HR 66 @ 0930  This is always high. I am on 100 mg of losartan for the past 7 months. I think it?s time to try something else   Electronically signed by Gwendel Hanson at 05/09/2022  9:57 AM EDT

## 2022-05-09 NOTE — Telephone Encounter (Signed)
Formatting of this note might be different from the original.  LMTCB.  Electronically signed by Kathreen Devoid, RN at 05/09/2022 11:57 AM EDT

## 2022-05-09 NOTE — Telephone Encounter (Signed)
Formatting of this note is different from the original.  Patient reported the following Bps:  Blood pressure reads requested per Dr HOA  BP  9/29 134/76 HR 64 @1330   10/2 140/78. HR 59 @ 1117  10/3 151/75. HR 66 @ 0930 (taken by nurse at hospital this am)  This is always high. I am on 100 mg of losartan for the past 7 months. I think it?s time to try something else     Patient is asymptomatic. Please advise on medication.  Electronically signed by Kathreen Devoid, RN at 05/09/2022  1:07 PM EDT

## 2022-05-10 MED ORDER — IRBESARTAN 150 MG PO TABS: 150.0000 mg | ORAL_TABLET | Freq: Every day | ORAL | 1 refills | Status: DC

## 2022-05-10 NOTE — Telephone Encounter (Signed)
Pixie Casino, MD  Kathreen Devoid, RN; Fidel Levy, RN Caller: Unspecified (Yesterday,  9:57 AM) Madaline Brilliant to switch losartan to irbesartan 150 mg daily.   Dr Lemmie Evens

## 2022-05-10 NOTE — Telephone Encounter (Signed)
Informed patient to stop taking losartan and to start taking irbesartan '150mg'$  daily. Patient concerned about side effects. She will start irbesartan today and she how she feels with taking it. She will keep a diary of her BP. She will contact us before she leaves for vacation next week, if she has any concerns. Order for irbesartan '150mg'$  sent to her pharmacy.

## 2022-05-10 NOTE — Addendum Note (Signed)
Addended by: Betha Loa F on: 05/10/2022 09:03 AM   Modules accepted: Orders

## 2022-05-10 NOTE — Addendum Note (Signed)
Addended by: Betha Loa F on: 05/10/2022 09:03 AM     Modules accepted: Orders      Electronically signed by Kathreen Devoid, RN at 05/10/2022  9:03 AM EDT

## 2022-05-10 NOTE — Telephone Encounter (Signed)
Formatting of this note might be different from the original.  Informed patient to stop taking losartan and to start taking irbesartan 150mg  daily. Patient concerned about side effects. She will start irbesartan today and she how she feels with taking it. She will keep a diary of her BP. She will contact us before she leaves for vacation next week, if she has any concerns. Order for irbesartan 150mg  sent to her pharmacy.  Electronically signed by Kathreen Devoid, RN at 05/10/2022 10:03 AM EDT

## 2022-05-10 NOTE — Telephone Encounter (Signed)
Formatting of this note might be different from the original.  Images from the original note were not included.  Pixie Casino, MD  Kathreen Devoid, RN; Fidel Levy, RN  Caller: Unspecified (Yesterday,  9:57 AM)  Madaline Brilliant to switch losartan to irbesartan 150 mg daily.     Dr Lemmie Evens   Electronically signed by Fidel Levy, RN at 05/10/2022  7:34 AM EDT

## 2022-05-22 ENCOUNTER — Ambulatory Visit: Payer: 59 | Admitting: General Practice

## 2022-06-01 ENCOUNTER — Other Ambulatory Visit: Payer: Self-pay | Admitting: Internal Medicine

## 2022-06-06 MED ORDER — IRBESARTAN 150 MG PO TABS: 150.0000 mg | ORAL_TABLET | Freq: Every day | ORAL | 3 refills | Status: DC

## 2022-07-23 DIAGNOSIS — K5732 Diverticulitis of large intestine without perforation or abscess without bleeding: Secondary | ICD-10-CM

## 2022-07-23 DIAGNOSIS — K5720 Diverticulitis of large intestine with perforation and abscess without bleeding: Secondary | ICD-10-CM

## 2022-07-23 NOTE — ED Provider Notes (Signed)
[facility]  Emergency Department Treatment Report    Patient: Breanna Chang Age: 51 y.o. Sex: female    Date of Birth: Jul 29, 1971 Admit Date: 07/23/2022 PCP: No primary care provider on file.   MRN: [id number]  CSN: [id number]     Room: ER46/ER46 Time Dictated: 11:30 PM      Payor: BCBS / Plan: BCBS OUT OF STATE / Product Type: *No Product type* /     Chief Complaint   Chief Complaint   Patient presents with    Abdominal Pain       History of Present Illness   51 y.o. female with history of coronary artery disease (stent placed in March of 2023), hyperlipidemia, hypertension, and previous diagnosis of diverticulitis who presents with complaint of left lower quadrant abdominal pain that radiates into the back.  She reports that this has been going on for about a month but was initially intermittent.  She reports it is worsened in the past week.  She reports the symptoms are consistent with the symptoms she had had about a year and a half ago when she was diagnosed with diverticulitis.  She reports severity as "8/10" currently and "10/10" at its worst.  She reports onset of symptoms was gradual and has been worsening in the past week. She reports pain is not aggravated or improved by anything specifically.  She reports associated diarrhea but denies any blood in her stool.  She denies any associated nausea, vomiting, or urinary symptoms.      She reports that she had been seen in [place] about a year and a half ago for similar episode of pain.  She reports that she was diagnosed with diverticulitis after obtaining a CT scan of the abdomen and pelvis.  She reports that she subsequently was followed up by a gastroenterologist in [place] and had a colonoscopy which she reports was "normal."  She denies any prior diagnosis of Crohn's disease or ulcerative colitis.  She reports that she is a traveler and does not have a gastroenterologist or primary care provider in this area.  She works in our hospital  as a Psychiatrist.    Review of Systems   Review of Systems   Constitutional:  Negative for chills, fatigue and fever.   HENT:  Positive for congestion. Negative for sore throat.         She reports a history of congestion with report she has had similar episodes of seasonal allergies when she is "on the [facility]."   Respiratory:  Negative for cough, shortness of breath and wheezing.    Cardiovascular:  Negative for chest pain and leg swelling.   Gastrointestinal:  Positive for abdominal pain, constipation (currently) and diarrhea (initially). Negative for blood in stool, nausea and vomiting.   Genitourinary:  Negative for dysuria, flank pain, frequency and hematuria.   Musculoskeletal:  Positive for back pain. Negative for neck pain.        She reports her left lower quadrant abdominal pain radiates into the left lower back.   Skin:  Negative for rash.   Neurological:  Negative for dizziness, weakness, numbness and headaches.   Hematological:  Does not bruise/bleed easily.        She is currently on Aspirin and Brilinta since her cardiac catheterization in March of this year and placement of stent.   Psychiatric/Behavioral:  Negative for confusion. The patient is not nervous/anxious.      Past Medical/Surgical History     Past  Medical History:   Diagnosis Date    CAD (coronary artery disease)     Hyperlipidemia 10/25/2021    Hypertension 10/25/2021    Melanoma of skin (HCC) 01/17/2019     Past Surgical History:   Procedure Laterality Date    CORONARY ANGIOPLASTY WITH STENT PLACEMENT  10/25/2021    DILATION AND CURETTAGE OF UTERUS  08/12/2019    ESOPHAGOGASTRODUODENOSCOPY  2012    TAH AND BSO (CERVIX REMOVED)  11/25/2019       Social History     Social History     Socioeconomic History    Marital status: Single   Tobacco Use    Smoking status: Former     Average packs/day: 0.1 packs/day for 28.0 years (2.0 ttl pk-yrs)     Types: Cigarettes     Start date: 08/03/1994    Smokeless tobacco: Never   Substance and  Sexual Activity    Alcohol use: Yes     Comment: occasional       Family History     Family History   Problem Relation Age of Onset    Hypertension Mother     Diabetes Mother     Alcohol Abuse Mother     Arthritis Mother     Depression Mother     Learning Disabilities Mother     Cancer Father     Pancreatic Cancer Father     Eczema Brother     Liver Disease Brother         hep c    Learning Disabilities Brother     Depression Brother     Drug Abuse Brother     Early Death Brother     Suicide Brother     Cancer Maternal Grandmother     Alcohol Abuse Maternal Grandmother     Breast Cancer Maternal Grandmother        Current Medications     Previous Medications    ASPIRIN 81 MG EC TABLET    Take 1 tablet by mouth daily    ESOMEPRAZOLE (NEXIUM) 20 MG DELAYED RELEASE CAPSULE    Take 1 capsule by mouth daily as needed    IRBESARTAN (AVAPRO) 150 MG TABLET    Take 1 tablet by mouth daily    METOPROLOL TARTRATE (LOPRESSOR) 25 MG TABLET    Take 1 tablet by mouth 2 times daily    NITROGLYCERIN (NITROSTAT) 0.4 MG SL TABLET    Place 1 tablet under the tongue every 5 minutes as needed    ROSUVASTATIN (CRESTOR) 40 MG TABLET    Take 1 tablet by mouth daily    TICAGRELOR (BRILINTA) 90 MG TABS TABLET    Take 1 tablet by mouth 2 times daily    TRIAMCINOLONE (KENALOG) 0.1 % CREAM    Apply topically 2 times daily    TUBERCULIN (TUBERSOL) 5 UNIT/0.1ML INJECTION    Inject 0.1 mL by intradermal route.       Allergies   No Known Allergies    Physical Exam     ED Triage Vitals [07/23/22 2250]   BP Temp Temp Source Pulse Respirations SpO2 Height Weight - Scale   (!) 143/86 97.7 F (36.5 C) Oral 61 18 99 % 1.626 m (5\' 4" ) 74.8 kg (165 lb)     Vitals:    07/23/22 2250   BP: (!) 143/86   Pulse: 61   Resp: 18   Temp: 97.7 F (36.5 C)   TempSrc: Oral   SpO2: 99%  Weight: 74.8 kg (165 lb)   Height: 1.626 m (5\' 4" )     I have reviewed documented vital signs, which are within age-appropriate parameters.   I have reviewed nursing  documentation.    Physical Exam  Vitals and nursing note reviewed.   Constitutional:       General: She is in acute distress (appears uncomfortable).      Appearance: She is normal weight.   HENT:      Head: Normocephalic and atraumatic.      Right Ear: External ear normal.      Left Ear: External ear normal.      Nose: Nose normal.      Mouth/Throat:      Mouth: Mucous membranes are moist.      Pharynx: Oropharynx is clear.   Eyes:      Conjunctiva/sclera: Conjunctivae normal.   Cardiovascular:      Rate and Rhythm: Normal rate and regular rhythm.      Heart sounds: No murmur heard.  Pulmonary:      Effort: Pulmonary effort is normal. No respiratory distress.      Breath sounds: Normal breath sounds.   Abdominal:      General: Bowel sounds are normal. There is no distension.      Palpations: Abdomen is soft.      Tenderness: There is abdominal tenderness in the left lower quadrant. There is no right CVA tenderness, left CVA tenderness, guarding or rebound.   Musculoskeletal:      Cervical back: Neck supple. No tenderness.      Right lower leg: No edema.      Left lower leg: No edema.   Skin:     General: Skin is warm and dry.      Findings: No rash.   Neurological:      General: No focal deficit present.      Mental Status: She is alert and oriented to person, place, and time.   Psychiatric:         Mood and Affect: Mood normal.         Behavior: Behavior normal.       Diagnostic Studies   Lab:   Results for orders placed or performed during the hospital encounter of 07/23/22   BMP   Result Value Ref Range    Potassium 3.8 3.5 - 5.1 mEq/L    Chloride 106 98 - 107 mEq/L    Sodium 138 136 - 145 mEq/L    CO2 24 20 - 31 mEq/L    Glucose 102 74 - 106 mg/dl    BUN 24 (H) 9 - 23 mg/dl    Creatinine 1.611.33 (H) 0.55 - 1.02 mg/dl    GFR African American 54.0      GFR Non-African American 45      Calcium 9.6 8.7 - 10.4 mg/dl    Anion Gap 8 5 - 15 mmol/L   CBC with Auto Differential   Result Value Ref Range    WBC 6.3 4.0 - 11.0  1000/mm3    RBC 4.32 3.60 - 5.20 M/uL    Hemoglobin 12.5 11.0 - 16.0 gm/dl    Hematocrit 09.637.4 04.535.0 - 47.0 %    MCV 86.6 80.0 - 98.0 fL    MCH 28.9 25.4 - 34.6 pg    MCHC 33.4 30.0 - 36.0 gm/dl    Platelets 409271 811140 - 450 1000/mm3    MPV 10.2 (H) 6.0 - 10.0 fL    RDW  40.1 36.4 - 46.3      Nucleated RBCs 0 0 - 0      Immature Granulocytes 0.3 0.0 - 3.0 %    Neutrophils Segmented 52.1 34 - 64 %    Lymphocytes 35.2 28 - 48 %    Monocytes 6.3 1 - 13 %    Eosinophils 5.5 (H) 0 - 5 %    Basophils 0.6 0 - 3 %     Based on my interpretation the BMP shows mild elevations of the BUN and creatinine consistent with some mild acute kidney injury without other significant acute abnormalities.  Based on my dictation the CBC shows no evidence of leukocytosis, anemia, or other significant acute abnormalities.  I have reviewed all ordered labs and used them in my medical decision making (MDM) as documented below.    Imaging:    CT ABDOMEN PELVIS W IV CONTRAST Additional Contrast? None    Result Date: 07/24/2022  CT ABDOMEN PELVIS W IV CONTRAST INDICATION:  LLQ abdominal pain, radiates into the left lower back, associated diarrhea, Hx diverticulitis. COMPARISON: No relevant studies. TECHNIQUE: CT ABDOMEN PELVIS W IV CONTRAST. Multiplanar projection reconstructions were performed and reviewed. All CT exams at this facility use one or more dose reduction techniques including automatic exposure control, mA/kV adjustment per patient's size, or iterative reconstruction technique. FINDINGS: LOWER THORAX: Negative LIVER: Negative BILIARY: Gallbladder is contracted, otherwise unremarkable. No biliary ductal dilation. SPLEEN: Negative ADRENALS: Negative PANCREAS: Negative RENAL: Negative. BLADDER: Negative REPRODUCTIVE ORGANS: Hysterectomy GI TRACT: Mild diverticulosis without active inflammation. Normal appendix. No evidence of bowel obstruction.  VASCULATURE: No evidence of aneurysm. LYMPH NODES: No abdominal or pelvic lymph nodes measuring  greater than 1 cm in short axis. PERITONEUM: There is no free air or free fluid. BONES: No acute osseous abnormalities. BODY WALL: Negative     IMPRESSION: No acute findings in the abdomen/pelvis Diverticulosis Electronically signed by: Linus Galas, DO 07/24/2022 12:37 AM EST          Workstation ID: [id number]      Based on my interpretation the CT scan of the abdomen pelvis with intravenous contrast shows evidence of diverticulosis without evidence of associated abscess or clear findings suggestive of diverticulitis or other significant acute abnormalities.    I have reviewed all ordered imaging studies and used them in my medical decision making (MDM) as documented below.    ED Course     RECORDS REVIEWED: I reviewed the patient's previous records here at [facility] and available outside facilities and note that the patient was seen back in March of this year for chest pain that resulted in cardiac catheterization and placement of a stent.  I reviewed her current medications but was unable to find any record of her previous evaluation by gastroenterology which apparently happened in [place].     SOCIAL DETERMINANTS impacting Evaluation and Management: Health services as the patient travels and does not have a primary care provider or gastroenterologist in the area.    Comorbidities impacting Evaluation and Management: Prior history of diverticulosis    Medical Decision Making   Chief Complaint: Abdominal Pain    51 y.o. female with abdominal pain with history and exam consistent with likely diverticulitis in a patient with known diverticulosis without evidence of abscess or other complication.    Initial considerations in this patient included diverticulitis and complications of diverticular disease including abscess and fistula among others, various etiologies of colitis including infectious and ischemic etiologies, urinary tract infection, bowel obstruction and  intra-abdominal masses,  pyelonephritis, and  ureteral stone among others.    Patient presented with complaints of abdominal pain in the left lower quadrant that radiates into the lower back.  The patient was noted to have a history of diverticulitis with similar episode of pain in the past.  The patient is noted to have no fever or other systemic infectious symptoms.  She reported some initial diarrhea which is now progressed to constipation.  She denies any blood in her stool.  She denies any urinary symptoms.  The patient was noted to have no findings suggestive of peritonitis on exam.  The patient has not been seen by gastroenterologist in the area but did have a colonoscopy in [place] which we were unable to review.  The patient is noted to have no evidence of significant leukocytosis or other abnormalities on labs aside from some elevation of the BUN and creatinine consistent with acute kidney injury.  She did report that she had noted a decreased appetite in the setting of ongoing abdominal pain.  CT scan did not show any clear evidence of abscess or diverticulitis though we did feel that there was some stranding and her clinical picture was consistent with diverticulitis.  We discussed treatment with antibiotics and short course of pain medications.  We also referred her to a gastroenterologist in the area for further follow-up.    Prior to discharge we discussed return precautions, specifically for worsening or persistent pain, treatment with antibiotics for diverticulitis and a short course of pain medications, and follow up with primary care doctor and gastroenterology for further evaluation, and the patient demonstrated understanding and agreement with this plan.    Medications   acetaminophen (OFIRMEV) infusion 1,000 mg (0 mg IntraVENous Stopped 07/23/22 2347)   iopamidol (ISOVUE-300) 61 % injection 85 mL (85 mLs IntraVENous Given 07/24/22 0023)   amoxicillin-clavulanate (AUGMENTIN) 875-125 MG per tablet 1 tablet (1 tablet Oral Given 07/24/22  0056)       Final Diagnosis     1. Diverticulitis of colon    2. Abdominal pain, left lower quadrant        Disposition   Discharged home with recommendation for close outpatient follow-up.        Isac Sarna, MD  [address]  [address]  [place] [address]  [phone]    In 1 week  Follow-up for further evaluation of abdominal pain with noted diverticulosis and a previous episode of diverticulitis.    Nile Dear, MD  [address]  [address]  [place] [address]  [phone]    In 1 week  Follow-up for further evaluation of abdominal pain with noted diverticulosis and a previous episode of diverticulitis.       Leonides Cave, DO  July 23, 2022    My signature above authenticates this document and my orders, the final    diagnosis (es), discharge prescription (s), and instructions in the Epic    record.  If you have any questions please contact [phone].     Nursing notes have been reviewed by the physician/ advanced practice    Clinician.    Dragon medical dictation software was used for portions of this report. Unintended voice recognition grammatical errors may occur.         Rae Halsted, DO  07/24/22 0112

## 2022-07-23 NOTE — ED Notes (Signed)
Dr Thurnell Garbe at [place], [place], [place]  07/23/22 [phone]

## 2022-07-23 NOTE — ED Notes (Signed)
IV started  Blood drawn and sent to lab  Acetaminophen started  Pt aware urine sample is needed and aware awaiting CT scan     Merilyn Baba, RN  07/23/22 2337

## 2022-07-23 NOTE — ED Triage Notes (Signed)
Pt ambulatory to triage. Pt dx with diverticulitis last year in April. Pt says she has had worsening LL abdominal and back pain for about a month with progressing pain.

## 2022-07-24 ENCOUNTER — Emergency Department: Admit: 2022-07-24 | Payer: BLUE CROSS/BLUE SHIELD

## 2022-07-24 ENCOUNTER — Inpatient Hospital Stay
Admit: 2022-07-24 | Discharge: 2022-07-24 | Disposition: A | Discharge status: Home / Self Care | Payer: BLUE CROSS/BLUE SHIELD | Attending: Student in an Organized Health Care Education/Training Program

## 2022-07-24 LAB — CBC WITH AUTO DIFFERENTIAL
Basophils: 0.6 % (ref 0–3)
Eosinophils: 5.5 % — ABNORMAL HIGH (ref 0–5)
Hematocrit: 37.4 % (ref 35.0–47.0)
Hemoglobin: 12.5 gm/dl (ref 11.0–16.0)
Immature Granulocytes: 0.3 % (ref 0.0–3.0)
Lymphocytes: 35.2 % (ref 28–48)
MCH: 28.9 pg (ref 25.4–34.6)
MCHC: 33.4 gm/dl (ref 30.0–36.0)
MCV: 86.6 fL (ref 80.0–98.0)
MPV: 10.2 fL — ABNORMAL HIGH (ref 6.0–10.0)
Monocytes: 6.3 % (ref 1–13)
Neutrophils Segmented: 52.1 % (ref 34–64)
Nucleated RBCs: 0 (ref 0–0)
Platelets: 271 10*3/uL (ref 140–450)
RBC: 4.32 M/uL (ref 3.60–5.20)
RDW: 40.1 (ref 36.4–46.3)
WBC: 6.3 10*3/uL (ref 4.0–11.0)

## 2022-07-24 LAB — BASIC METABOLIC PANEL
Anion Gap: 8 mmol/L (ref 5–15)
BUN: 24 mg/dl — ABNORMAL HIGH (ref 9–23)
CO2: 24 mEq/L (ref 20–31)
Calcium: 9.6 mg/dl (ref 8.7–10.4)
Chloride: 106 mEq/L (ref 98–107)
Creatinine: 1.33 mg/dl — ABNORMAL HIGH (ref 0.55–1.02)
GFR African American: 54
GFR Non-African American: 45
Glucose: 102 mg/dl (ref 74–106)
Potassium: 3.8 mEq/L (ref 3.5–5.1)
Sodium: 138 mEq/L (ref 136–145)

## 2022-07-24 MED ORDER — AMOXICILLIN-POT CLAVULANATE 875-125 MG PO TABS
875-125 MG | ORAL_TABLET | Freq: Two times a day (BID) | ORAL | 0 refills | Status: AC
Start: 2022-07-24 — End: 2022-07-31

## 2022-07-24 MED ORDER — ACETAMINOPHEN 10 MG/ML IV SOLN
10 MG/ML | Freq: Once | INTRAVENOUS | Status: AC
Start: 2022-07-24 — End: 2022-07-23
  Administered 2022-07-24: 05:00:00 1000 mg via INTRAVENOUS

## 2022-07-24 MED ORDER — HYDROCODONE-ACETAMINOPHEN 5-325 MG PO TABS
5-325 MG | ORAL_TABLET | Freq: Four times a day (QID) | ORAL | 0 refills | Status: AC | PRN
Start: 2022-07-24 — End: 2022-07-27

## 2022-07-24 MED ORDER — ACETAMINOPHEN 10 MG/ML IV SOLN
10 MG/ML | Freq: Once | INTRAVENOUS | Status: DC
Start: 2022-07-24 — End: 2022-07-24

## 2022-07-24 MED ORDER — AMOXICILLIN-POT CLAVULANATE 875-125 MG PO TABS
875-125 MG | ORAL | Status: AC
Start: 2022-07-24 — End: 2022-07-24
  Administered 2022-07-24: 06:00:00 1 via ORAL

## 2022-07-24 MED ORDER — LOPERAMIDE HCL 2 MG PO CAPS
2 MG | ORAL_CAPSULE | Freq: Four times a day (QID) | ORAL | 0 refills | Status: AC | PRN
Start: 2022-07-24 — End: 2022-08-03

## 2022-07-24 MED ORDER — IOPAMIDOL 61 % IV SOLN
61 % | Freq: Once | INTRAVENOUS | Status: AC | PRN
Start: 2022-07-24 — End: 2022-07-24
  Administered 2022-07-24: 05:00:00 85 mL via INTRAVENOUS

## 2022-07-24 MED FILL — ISOVUE-300 61 % IV SOLN: 61 % | INTRAVENOUS | Qty: 85

## 2022-07-24 MED FILL — ACETAMINOPHEN 10 MG/ML IV SOLN: 10 MG/ML | INTRAVENOUS | Qty: 100

## 2022-07-24 MED FILL — AMOXICILLIN-POT CLAVULANATE 875-125 MG PO TABS: 875-125 MG | ORAL | Qty: 1

## 2022-07-24 NOTE — Discharge Instructions (Signed)
Based on our clinical examination in the emergency department today, we feel you are safe for discharge home with the follow up recommendations noted above. You should return to the emergency department if your condition worsens significantly, fails to improve as we discussed, or if you note any new or concerning symptoms.    The following labs were obtained during your emergency department evaluation, which you should bring to your next primary care provider appointment for follow up:    Results for orders placed or performed during the hospital encounter of 07/23/22   BMP   Result Value Ref Range    Potassium 3.8 3.5 - 5.1 mEq/L    Chloride 106 98 - 107 mEq/L    Sodium 138 136 - 145 mEq/L    CO2 24 20 - 31 mEq/L    Glucose 102 74 - 106 mg/dl    BUN 24 (H) 9 - 23 mg/dl    Creatinine 8.67 (H) 0.55 - 1.02 mg/dl    GFR African American 54.0      GFR Non-African American 45      Calcium 9.6 8.7 - 10.4 mg/dl    Anion Gap 8 5 - 15 mmol/L   CBC with Auto Differential   Result Value Ref Range    WBC 6.3 4.0 - 11.0 1000/mm3    RBC 4.32 3.60 - 5.20 M/uL    Hemoglobin 12.5 11.0 - 16.0 gm/dl    Hematocrit 61.9 50.9 - 47.0 %    MCV 86.6 80.0 - 98.0 fL    MCH 28.9 25.4 - 34.6 pg    MCHC 33.4 30.0 - 36.0 gm/dl    Platelets 326 712 - 450 1000/mm3    MPV 10.2 (H) 6.0 - 10.0 fL    RDW 40.1 36.4 - 46.3      Nucleated RBCs 0 0 - 0      Immature Granulocytes 0.3 0.0 - 3.0 %    Neutrophils Segmented 52.1 34 - 64 %    Lymphocytes 35.2 28 - 48 %    Monocytes 6.3 1 - 13 %    Eosinophils 5.5 (H) 0 - 5 %    Basophils 0.6 0 - 3 %     The following imaging studies were obtained during your emergency department evaluation, which you should bring to your next primary care provider appointment for follow up:    CT ABDOMEN PELVIS W IV CONTRAST Additional Contrast? None    Result Date: 07/24/2022  CT ABDOMEN PELVIS W IV CONTRAST INDICATION:  LLQ abdominal pain, radiates into the left lower back, associated diarrhea, Hx diverticulitis. COMPARISON:  No relevant studies. TECHNIQUE: CT ABDOMEN PELVIS W IV CONTRAST. Multiplanar projection reconstructions were performed and reviewed. All CT exams at this facility use one or more dose reduction techniques including automatic exposure control, mA/kV adjustment per patient's size, or iterative reconstruction technique. FINDINGS: LOWER THORAX: Negative LIVER: Negative BILIARY: Gallbladder is contracted, otherwise unremarkable. No biliary ductal dilation. SPLEEN: Negative ADRENALS: Negative PANCREAS: Negative RENAL: Negative. BLADDER: Negative REPRODUCTIVE ORGANS: Hysterectomy GI TRACT: Mild diverticulosis without active inflammation. Normal appendix. No evidence of bowel obstruction.  VASCULATURE: No evidence of aneurysm. LYMPH NODES: No abdominal or pelvic lymph nodes measuring greater than 1 cm in short axis. PERITONEUM: There is no free air or free fluid. BONES: No acute osseous abnormalities. BODY WALL: Negative     IMPRESSION: No acute findings in the abdomen/pelvis Diverticulosis Electronically signed by: Linus Galas, DO 07/24/2022 12:37 AM EST  Workstation ID: [id number]

## 2022-07-24 NOTE — ED Notes (Signed)
Pt taken to CT scan via wheelchair     Merilyn Baba, RN  07/24/22 [phone]

## 2022-07-24 NOTE — ED Notes (Signed)
Pt returned from [place] scan     Merilyn Baba, RN  07/24/22 [phone]

## 2022-07-24 NOTE — ED Notes (Signed)
Pt seen, treated, and discharged by provider.  I did not physically care for this pt.      Carles Collet, LPN  16/10/96 0454

## 2022-07-27 ENCOUNTER — Encounter (HOSPITAL_BASED_OUTPATIENT_CLINIC_OR_DEPARTMENT_OTHER): Payer: Self-pay | Admitting: Internal Medicine

## 2022-07-28 NOTE — Telephone Encounter (Signed)
Faxed Brilinta copay card.  Called [facility], will fill and let pt know when ready

## 2022-07-28 NOTE — Telephone Encounter (Signed)
Called pharmacy, they are closed opens at [place] . Will call again later

## 2022-07-28 NOTE — Telephone Encounter (Addendum)
Pt came and picked up pt assistance forms to fill out and will discuss with pharmacy if copay assistance is needed. Also gave her the copay card for commercial patients.

## 2022-08-14 ENCOUNTER — Encounter (HOSPITAL_BASED_OUTPATIENT_CLINIC_OR_DEPARTMENT_OTHER): Payer: Self-pay | Admitting: Internal Medicine

## 2022-09-18 ENCOUNTER — Other Ambulatory Visit: Payer: Self-pay | Admitting: *Deleted

## 2022-09-18 DIAGNOSIS — I10 Essential (primary) hypertension: Secondary | ICD-10-CM

## 2022-09-18 DIAGNOSIS — R9439 Abnormal result of other cardiovascular function study: Secondary | ICD-10-CM

## 2022-09-18 NOTE — Telephone Encounter (Signed)
Pt hasn't been seen in 1 year, needs appointment

## 2022-11-07 ENCOUNTER — Encounter (HOSPITAL_BASED_OUTPATIENT_CLINIC_OR_DEPARTMENT_OTHER): Payer: Self-pay | Admitting: Internal Medicine

## 2022-11-08 NOTE — Addendum Note (Signed)
Addended by: Fidel Levy on: 11/08/2022 07:59 AM   Modules accepted: Orders

## 2022-11-12 ENCOUNTER — Other Ambulatory Visit: Payer: Self-pay | Admitting: Cardiology

## 2022-11-12 DIAGNOSIS — Z13220 Encounter for screening for lipoid disorders: Secondary | ICD-10-CM

## 2022-11-13 IMAGING — MR MR HEAD W/O CM
9 of 10 series · 38 of 48 positions shown · non-contrast
Comparison: None.

CLINICAL DATA: Left arm, left leg, and left face numbness, stroke
suspected

EXAM:
MRI HEAD WITHOUT CONTRAST
TECHNIQUE: Multiplanar, multiecho pulse sequences of the brain and surrounding
structures were obtained without intravenous contrast.

[Series 3: DWI · axial · 3.0mm · 1.09mm/px · z∈[-66,+93]mm · 11 of 108 slices shown (1 of 4)]
[im 1/108]
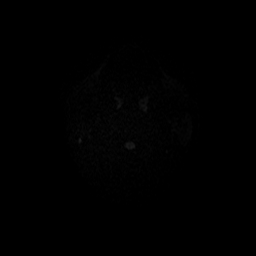
[im 11/108]
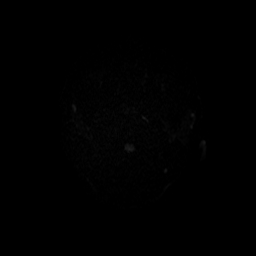
[im 22/108]
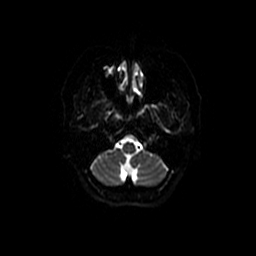
[im 33/108]
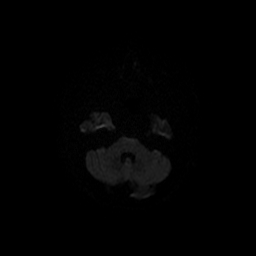
[im 43/108]
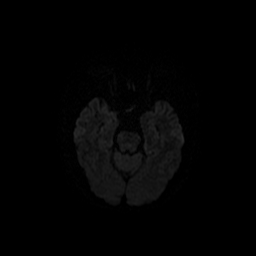
[im 54/108]
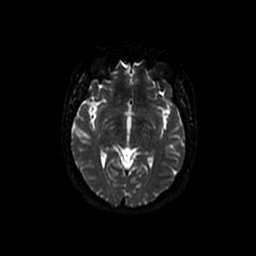
[im 65/108]
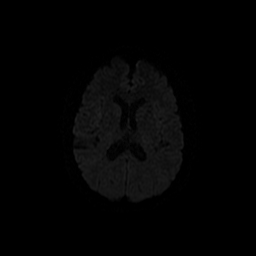
[im 75/108]
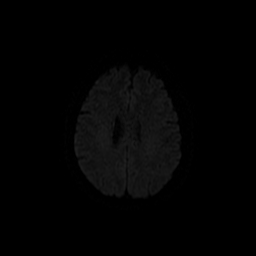
[im 86/108]
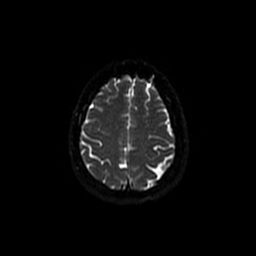
[im 97/108]
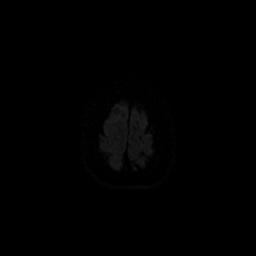
[im 108/108]
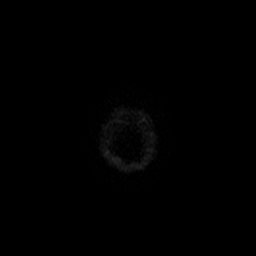

[Series 4: DWI · coronal · 5.0mm · 1.09mm/px · 7 of 74 slices shown (2 of 4)]
[im 1/74]
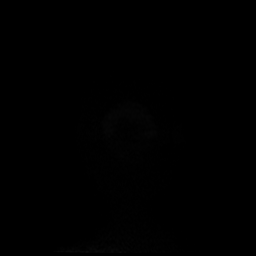
[im 13/74]
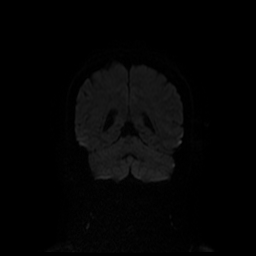
[im 25/74]
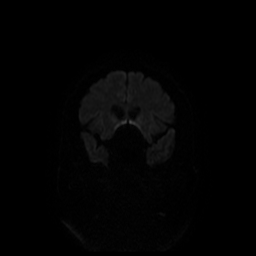
[im 37/74]
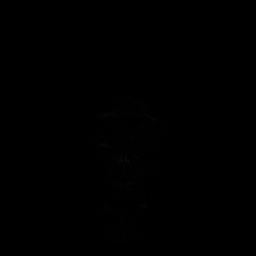
[im 49/74]
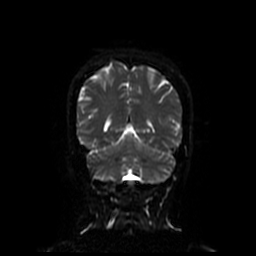
[im 61/74]
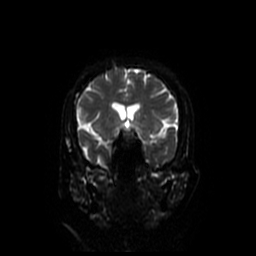
[im 74/74]
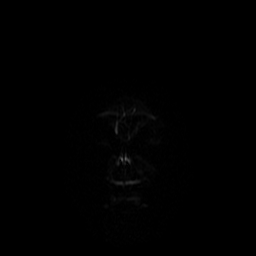

[Series 5: T1 · sagittal · 5.0mm · 0.47mm/px · 2 of 25 slices shown (1 of 2)]
[im 1/25]
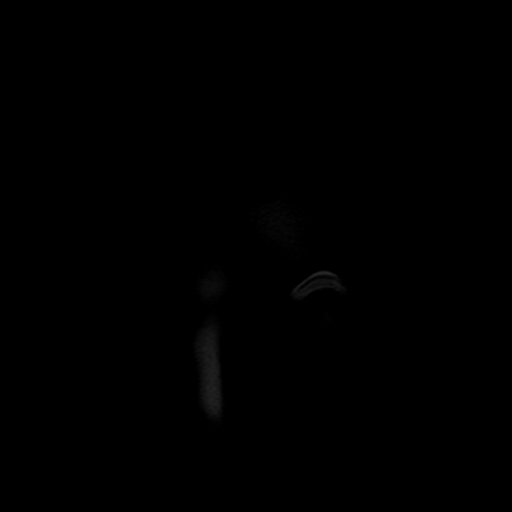
[im 25/25]
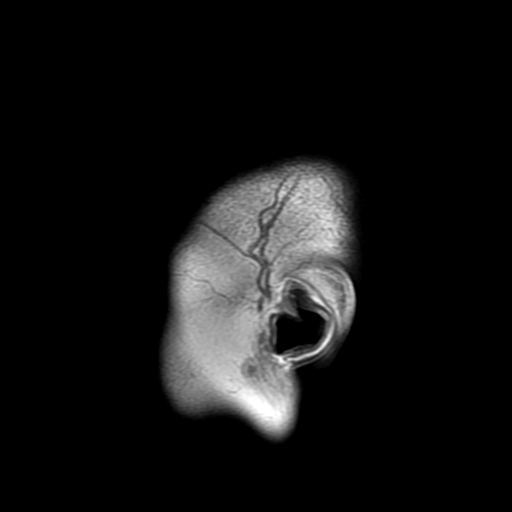

[Series 6: T2 · axial · 5.0mm · 0.47mm/px · z∈[-58,+91]mm · 2 of 26 slices shown (1 of 2)]
[im 1/26]
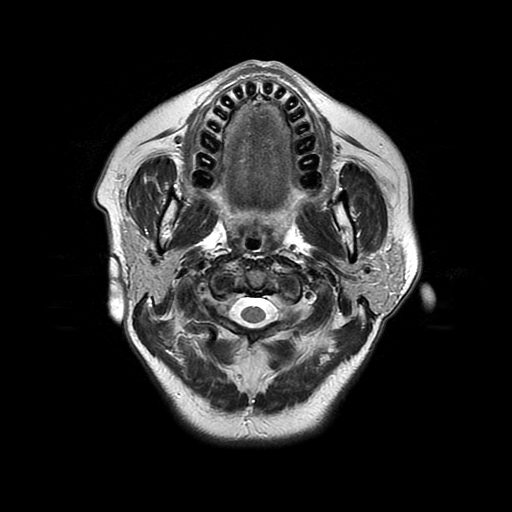
[im 26/26]
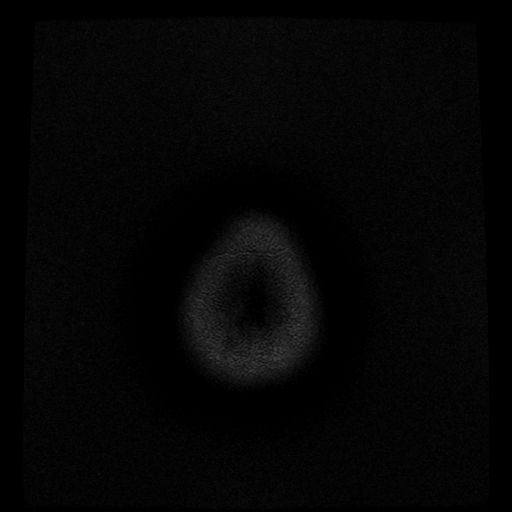

[Series 7: FLAIR · axial · 3.0mm · 0.47mm/px · z∈[-58,+91]mm · 2 of 26 slices shown]
[im 1/26]
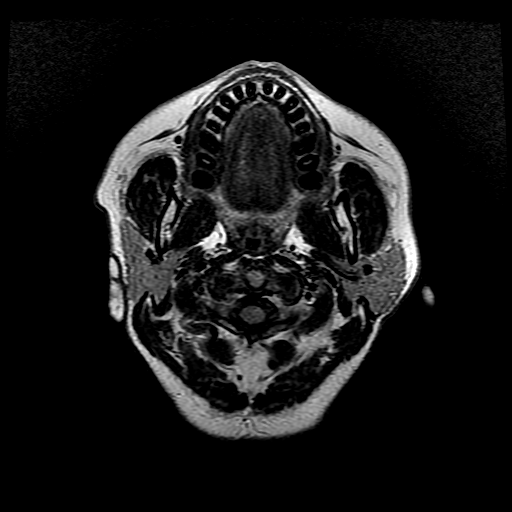
[im 26/26]
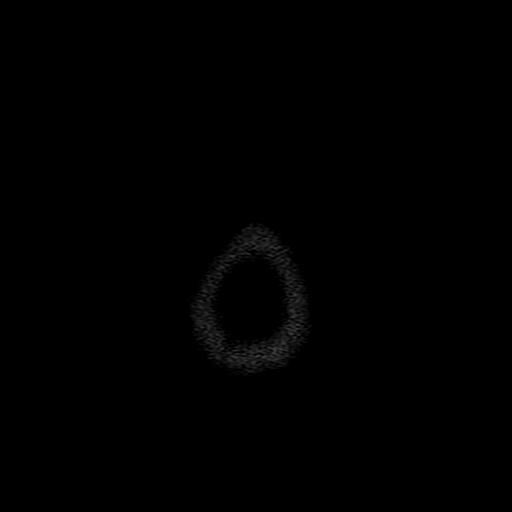

[Series 9: T1 · axial · 3.0mm · 0.47mm/px · z∈[-60,-43]mm · 2 of 104 slices shown (2 of 2)]
[im 1/104]
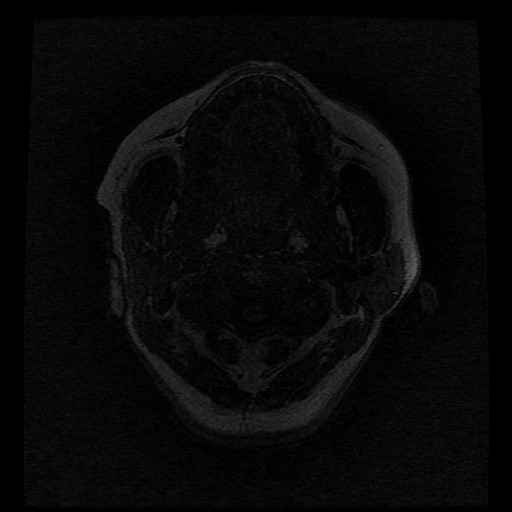
[im 12/104]
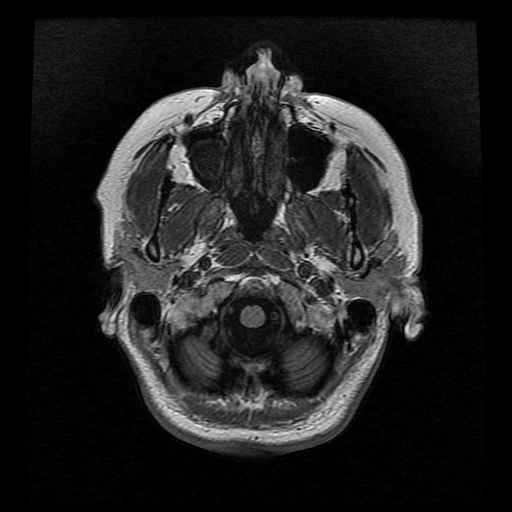

[Series 10: T2 · coronal · 5.0mm · 0.39mm/px · 3 of 28 slices shown (2 of 2)]
[im 1/28]
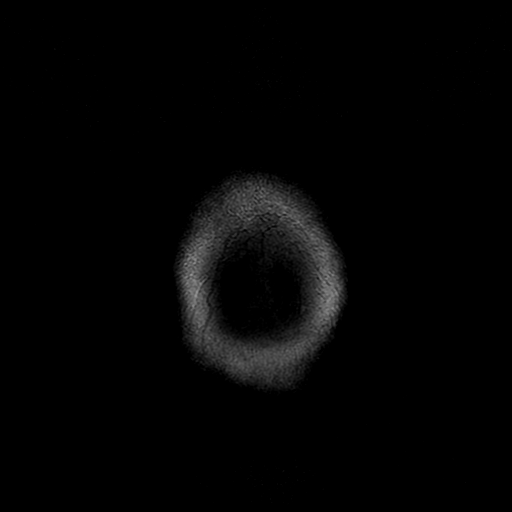
[im 14/28]
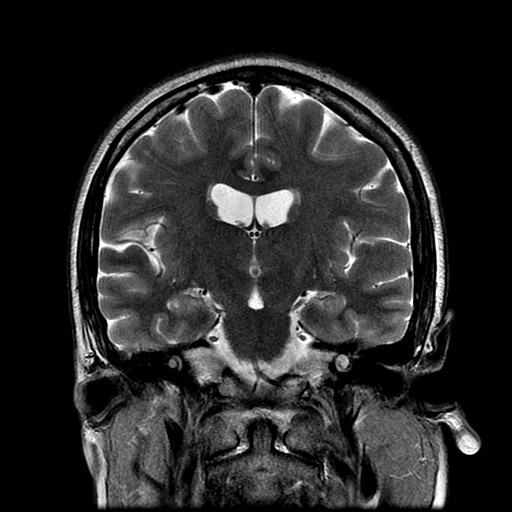
[im 28/28]
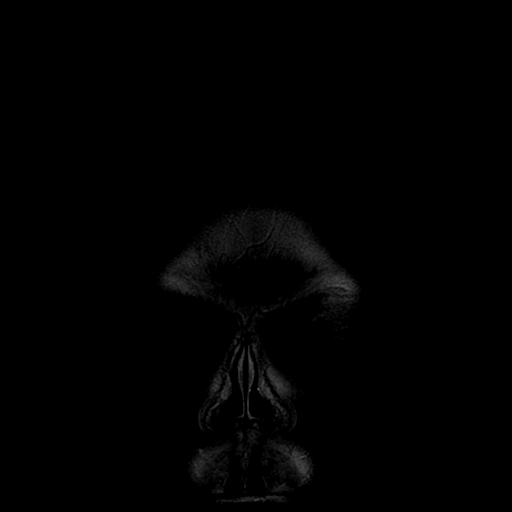

[Series 300: DWI · axial · 3.0mm · 1.09mm/px · z∈[-66,+93]mm · 5 of 54 slices shown (3 of 4)]
[im 1/54]
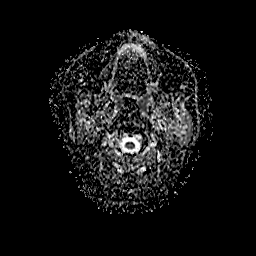
[im 14/54]
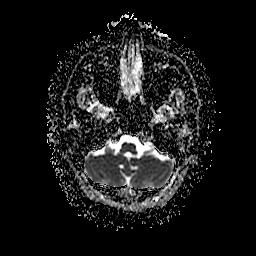
[im 27/54]
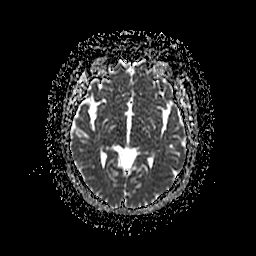
[im 40/54]
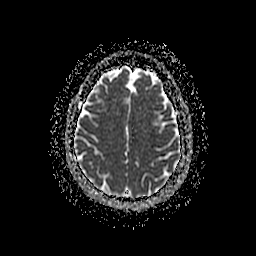
[im 54/54]
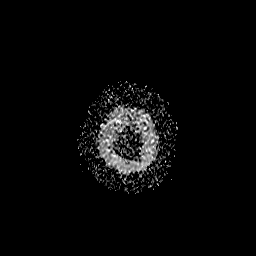

[Series 400: DWI · coronal · 5.0mm · 1.09mm/px · 4 of 37 slices shown (4 of 4)]
[im 1/37]
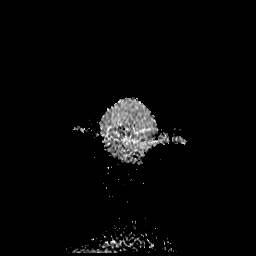
[im 13/37]
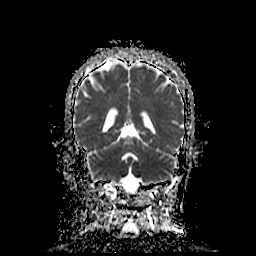
[im 25/37]
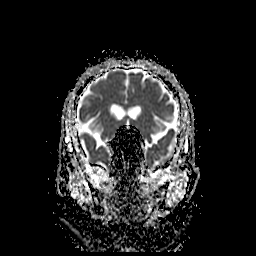
[im 37/37]
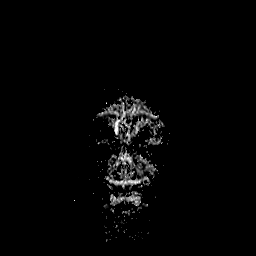

[38 of 48 positions shown; findings below may reference images not displayed]

FINDINGS: Brain: No restricted diffusion to suggest acute or subacute infarct.
No acute hemorrhage, mass, mass effect, or midline shift. No
hydrocephalus or extra-axial collection.

Vascular: Normal flow voids.

Skull and upper cervical spine: Normal marrow signal.

Sinuses/Orbits: Mucous retention cyst in the right maxillary sinus.
The paranasal sinuses are otherwise clear. The orbits are
unremarkable.

Other: The mastoids are well aerated.
IMPRESSION: No acute intracranial process.

## 2022-12-18 ENCOUNTER — Other Ambulatory Visit: Payer: Self-pay | Admitting: *Deleted

## 2022-12-18 DIAGNOSIS — R9439 Abnormal result of other cardiovascular function study: Secondary | ICD-10-CM

## 2022-12-18 DIAGNOSIS — I10 Essential (primary) hypertension: Secondary | ICD-10-CM

## 2022-12-18 NOTE — Telephone Encounter (Signed)
Has been >1 year since pt was seen in this office, please have patient schedule an appointment in order to get her refills

## 2022-12-28 ENCOUNTER — Encounter: Payer: Self-pay | Admitting: Family Medicine

## 2022-12-28 DIAGNOSIS — I10 Essential (primary) hypertension: Secondary | ICD-10-CM

## 2022-12-28 DIAGNOSIS — R9439 Abnormal result of other cardiovascular function study: Secondary | ICD-10-CM

## 2022-12-28 MED ORDER — METOPROLOL TARTRATE 25 MG PO TABS: 25.0000 mg | ORAL_TABLET | Freq: Two times a day (BID) | ORAL | 0 refills | Status: DC

## 2022-12-28 NOTE — Telephone Encounter (Signed)
Pt states she is almost out of this medication and really needs a refill.  Pt has already been scheduled for a Transfer of Care - 03/23/23.  Pt states she is traveling for work and won't return until then, but would like to take/have her medication while she is away.   Please advise.   [id number] [place], [place] - [address] Phone: [phone]  Fax: [phone]

## 2023-02-16 ENCOUNTER — Encounter: Payer: Self-pay | Admitting: Family Medicine

## 2023-02-28 ENCOUNTER — Encounter: Payer: Self-pay | Admitting: Family Medicine

## 2023-02-28 ENCOUNTER — Ambulatory Visit (INDEPENDENT_AMBULATORY_CARE_PROVIDER_SITE_OTHER): Payer: BC Managed Care – PPO

## 2023-02-28 ENCOUNTER — Ambulatory Visit (INDEPENDENT_AMBULATORY_CARE_PROVIDER_SITE_OTHER): Payer: BC Managed Care – PPO | Admitting: Family Medicine

## 2023-02-28 VITALS — BP 108/72 | HR 68 | Temp 98.2°F | Wt 168.6 lb

## 2023-02-28 DIAGNOSIS — M79672 Pain in left foot: Secondary | ICD-10-CM | POA: Diagnosis not present

## 2023-02-28 DIAGNOSIS — M7731 Calcaneal spur, right foot: Secondary | ICD-10-CM | POA: Diagnosis not present

## 2023-02-28 DIAGNOSIS — M7732 Calcaneal spur, left foot: Secondary | ICD-10-CM | POA: Diagnosis not present

## 2023-02-28 DIAGNOSIS — M79671 Pain in right foot: Secondary | ICD-10-CM

## 2023-02-28 NOTE — Progress Notes (Signed)
   Subjective:    Patient ID: Megan Kelley, female    DOB: 1970/12/21, 52 y.o.   MRN: [id number]  HPI Here complaining of pain in both feet since an accident that occurred 3 months ago. That day she jumped into a pool feet first which was only a few feet deep. She landed with both heels striking the bottom of the pool. Since then she has had constant pain in the bottoms of both heels as well as in the lateral portions of both feet. She has been working since then, and she always wears supportive shoes. There was never any bruising or swelling.    Review of Systems  Constitutional: Negative.   Respiratory: Negative.    Cardiovascular: Negative.   Musculoskeletal:  Positive for arthralgias.       Objective:   Physical Exam Constitutional:      General: She is not in acute distress.    Appearance: Normal appearance.  Cardiovascular:     Rate and Rhythm: Normal rate and regular rhythm.     Pulses: Normal pulses.     Heart sounds: Normal heart sounds.  Pulmonary:     Effort: Pulmonary effort is normal.     Breath sounds: Normal breath sounds.  Musculoskeletal:     Comments: Both feet are normal on exam. No ecchymoses or swelling. No tenderness   Neurological:     Mental Status: She is alert.           Assessment & Plan:  Bilateral foot and heel pain after a contusion injury. We will get Xrays of both feet and both heels today.  Gershon Crane, MD

## 2023-03-02 ENCOUNTER — Encounter: Payer: Self-pay | Admitting: Family Medicine

## 2023-03-05 NOTE — Telephone Encounter (Signed)
Pt would like the result of foot xrays

## 2023-03-07 ENCOUNTER — Telehealth: Payer: Self-pay | Admitting: Family Medicine

## 2023-03-07 ENCOUNTER — Encounter: Payer: Self-pay | Admitting: Family Medicine

## 2023-03-07 ENCOUNTER — Encounter (INDEPENDENT_AMBULATORY_CARE_PROVIDER_SITE_OTHER): Payer: Self-pay

## 2023-03-07 NOTE — Telephone Encounter (Signed)
Prescription Request  03/07/2023  LOV: 02/28/2023  What is the name of the medication or equipment?  irbesartan irbesartan (AVAPRO) 150 MG tablet  Pt states she only has 4 pills left. Pt states this Rx was prescribed by another provider, but she does not see that provider anymore. Pt has a Transfer of Care scheduled with Dr. Casimiro Needle on 03/23/23. Pt would like to know who can refill this Rx for her?  Have you contacted your pharmacy to request a refill? No   Which pharmacy would you like this sent to?   [id number] [place], [place] - [address] [place] [address] Phone: [phone] Fax: [phone]   Patient notified that their request is being sent to the clinical staff for review and that they should receive a response within 2 business days.   Please advise at Mobile [phone] (mobile)

## 2023-03-07 NOTE — Telephone Encounter (Signed)
Pt is calling and would like foot xrays result

## 2023-03-07 NOTE — Telephone Encounter (Signed)
Ok to refill  90 days with 1 refill  

## 2023-03-08 MED ORDER — IRBESARTAN 150 MG PO TABS
150.0000 mg | ORAL_TABLET | Freq: Every day | ORAL | 1 refills | Status: DC
  Filled 2023-07-10: qty 90, 90d supply, fill #0

## 2023-03-08 NOTE — Telephone Encounter (Signed)
Rx done. 

## 2023-03-12 ENCOUNTER — Ambulatory Visit: Payer: 59 | Admitting: Family Medicine

## 2023-03-13 ENCOUNTER — Other Ambulatory Visit: Payer: Self-pay | Admitting: *Deleted

## 2023-03-13 DIAGNOSIS — M79671 Pain in right foot: Secondary | ICD-10-CM

## 2023-03-23 ENCOUNTER — Ambulatory Visit: Payer: BC Managed Care – PPO | Admitting: Family Medicine

## 2023-03-23 ENCOUNTER — Encounter: Payer: Self-pay | Admitting: Family Medicine

## 2023-03-23 VITALS — BP 118/84 | HR 65 | Temp 98.3°F | Ht 64.0 in | Wt 171.0 lb

## 2023-03-23 DIAGNOSIS — M791 Myalgia, unspecified site: Secondary | ICD-10-CM | POA: Diagnosis not present

## 2023-03-23 DIAGNOSIS — Z6829 Body mass index (BMI) 29.0-29.9, adult: Secondary | ICD-10-CM

## 2023-03-23 DIAGNOSIS — L308 Other specified dermatitis: Secondary | ICD-10-CM

## 2023-03-23 DIAGNOSIS — L309 Dermatitis, unspecified: Secondary | ICD-10-CM | POA: Insufficient documentation

## 2023-03-23 DIAGNOSIS — I2510 Atherosclerotic heart disease of native coronary artery without angina pectoris: Secondary | ICD-10-CM | POA: Diagnosis not present

## 2023-03-23 DIAGNOSIS — E782 Mixed hyperlipidemia: Secondary | ICD-10-CM | POA: Diagnosis not present

## 2023-03-23 DIAGNOSIS — I10 Essential (primary) hypertension: Secondary | ICD-10-CM | POA: Diagnosis not present

## 2023-03-23 DIAGNOSIS — Z1231 Encounter for screening mammogram for malignant neoplasm of breast: Secondary | ICD-10-CM

## 2023-03-23 LAB — COMPREHENSIVE METABOLIC PANEL
ALT: 40 U/L — ABNORMAL HIGH (ref 0–35)
AST: 28 U/L (ref 0–37)
Albumin: 4.7 g/dL (ref 3.5–5.2)
Alkaline Phosphatase: 76 U/L (ref 39–117)
BUN: 15 mg/dL (ref 6–23)
CO2: 26 mEq/L (ref 19–32)
Calcium: 10.2 mg/dL (ref 8.4–10.5)
Chloride: 99 mEq/L (ref 96–112)
Creatinine, Ser: 1.03 mg/dL (ref 0.40–1.20)
GFR: 62.3 mL/min (ref >60.00)
Glucose, Bld: 113 mg/dL — ABNORMAL HIGH (ref 70–99)
Potassium: 3.9 mEq/L (ref 3.5–5.1)
Sodium: 134 mEq/L — ABNORMAL LOW (ref 135–145)
Total Bilirubin: 0.7 mg/dL (ref 0.2–1.2)
Total Protein: 7.4 g/dL (ref 6.0–8.3)

## 2023-03-23 LAB — LIPID PANEL
Cholesterol: 118 mg/dL (ref 0–200)
HDL: 48.4 mg/dL (ref >39.00)
LDL Cholesterol: 46 mg/dL (ref 0–99)
NonHDL: 69.55
Total CHOL/HDL Ratio: 2
Triglycerides: 118 mg/dL (ref 0.0–149.0)
VLDL: 23.6 mg/dL (ref 0.0–40.0)

## 2023-03-23 LAB — TSH: TSH: 3.2 u[IU]/mL (ref 0.35–5.50)

## 2023-03-23 LAB — CK: Total CK: 80 U/L (ref 7–177)

## 2023-03-23 MED ORDER — TRIAMCINOLONE ACETONIDE 0.1 % EX CREA
1.0000 | TOPICAL_CREAM | Freq: Two times a day (BID) | CUTANEOUS | 5 refills | Status: DC
Start: 2023-03-23 — End: 2023-07-25

## 2023-03-23 MED ORDER — WEGOVY 0.25 MG/0.5ML ~~LOC~~ SOAJ
0.2500 mg | SUBCUTANEOUS | 0 refills | Status: DC
Start: 2023-03-23 — End: 2023-07-25

## 2023-03-23 NOTE — Assessment & Plan Note (Signed)
Almost looks like small patches of psoriasis, will reorder her triamcinolone cream

## 2023-03-23 NOTE — Progress Notes (Signed)
Established Patient Office Visit  Subjective   Patient ID: Megan Kelley, female    DOB: 09/13/1970  Age: 52 y.o. MRN: [id number]  Chief Complaint  Patient presents with   Establish Care    Patient is here for [facility] visit. She recently saw Dr. Clent Ridges for foot injury. States that there were no fractures and she has been doing foot stretches and is slowly getting better. Is also using tylenol extra strength and this seems to be helping.   CAD-- pt had a stent placement in March of 2023. She reports that she is experiencing some weight gain since then. States that there is no chest pain or SOB. States that she is having a lot of aches and pains, feels like her body has been "hit by a truck" most of the time.     Current Outpatient Medications  Medication Instructions   aspirin EC 81 mg, Oral, Daily, Swallow whole.   esomeprazole (NEXIUM) 20 mg, Oral, Daily PRN   irbesartan (AVAPRO) 150 mg, Oral, Daily   irbesartan (AVAPRO) 150 mg, Oral, Daily   metoprolol tartrate (LOPRESSOR) 25 mg, Oral, 2 times daily   nitroGLYCERIN (NITROSTAT) 0.4 mg, Sublingual, Every 5 min PRN   rosuvastatin (CRESTOR) 40 MG tablet TAKE 1 TABLET BY MOUTH EVERY DAY   triamcinolone cream (KENALOG) 0.1 % 1 Application, Topical, 2 times daily   Wegovy 0.25 mg, Subcutaneous, Weekly    Patient Active Problem List   Diagnosis Date Noted   Eczema 03/23/2023   CAD (coronary artery disease) 10/25/2021   Hyperlipidemia 10/25/2021   Hypertension 10/25/2021   Melanoma of skin (HCC) 01/17/2019      Review of Systems  All other systems reviewed and are negative.     Objective:     BP 118/84   Pulse 65   Temp 98.3 F (36.8 C) (Oral)   Ht 5\' 4"  (1.626 m)   Wt 171 lb (77.6 kg)   LMP 10/25/2019   SpO2 98%   BMI 29.35 kg/m    Physical Exam Vitals reviewed.  Constitutional:      Appearance: Normal appearance. She is well-groomed and normal weight.  Eyes:     Conjunctiva/sclera: Conjunctivae normal.   Cardiovascular:     Rate and Rhythm: Normal rate and regular rhythm.     Pulses: Normal pulses.     Heart sounds: S1 normal and S2 normal.  Pulmonary:     Effort: Pulmonary effort is normal.     Breath sounds: Normal breath sounds and air entry.  Musculoskeletal:     Right lower leg: No edema.     Left lower leg: No edema.  Skin:    Findings: Lesion present.     Comments: Round, dark red/purplish small lesions on the legs BL, two on the left and 2 on the right, one is around the size of a dime and has flaking skin, slightly raised.   Neurological:     Mental Status: She is alert and oriented to person, place, and time. Mental status is at baseline.     Gait: Gait is intact.  Psychiatric:        Mood and Affect: Mood and affect normal.        Speech: Speech normal.        Behavior: Behavior normal.        Judgment: Judgment normal.      No results found for any visits on 03/23/23.    The ASCVD Risk score (Arnett DK, et  al., 2019) failed to calculate for the following reasons:   The valid total cholesterol range is 130 to 320 mg/dL    Assessment & Plan:  Mixed hyperlipidemia Assessment & Plan: Checking new lipid panel and CMP, consider reducing the dose of crestor to help with the joint and muscle aches she is having  Orders: -     Lipid panel  Primary hypertension Assessment & Plan: BP is well controlled today, continue medications listed below as prescribed. Current hypertension medications:       Sig   irbesartan (AVAPRO) 150 MG tablet (Taking) Take 1 tablet (150 mg total) by mouth daily.   irbesartan (AVAPRO) 150 MG tablet (Taking) Take 1 tablet (150 mg total) by mouth daily.   metoprolol tartrate (LOPRESSOR) 25 MG tablet (Taking) Take 1 tablet (25 mg total) by mouth 2 (two) times daily.        Orders: -     Comprehensive metabolic panel -     TSH  Myalgia -     CK  Coronary artery disease involving native coronary artery of native heart without  angina pectoris Assessment & Plan: Patient continues on BB and statin, however she is complaining about weight gain and muscle aches, both of which are likely a result of her medications. I will order her annual labs and we discussed starting Wegovy for CAD with BMI >25. Pt is agreeable to starting this medication. I will start the process of getting it approved.   Orders: -     Wegovy; Inject 0.25 mg into the skin once a week.  Dispense: 2 mL; Refill: 0  BMI 29.0-29.9,adult -     ZOXWRU; Inject 0.25 mg into the skin once a week.  Dispense: 2 mL; Refill: 0  Breast cancer screening by mammogram -     3D Screening Mammogram, Left and Right; Future  Other eczema Assessment & Plan: Almost looks like small patches of psoriasis, will reorder her triamcinolone cream  Orders: -     Triamcinolone Acetonide; Apply 1 Application topically 2 (two) times daily.  Dispense: 30 g; Refill: 5   I spent 30 minutes with the patient today reviewing notes from cardiology, medications and her symptoms.   Return in about 3 months (around 06/23/2023) for annual physical exam.    Karie Georges, MD

## 2023-03-23 NOTE — Assessment & Plan Note (Signed)
BP is well controlled today, continue medications listed below as prescribed. Current hypertension medications:       Sig   irbesartan (AVAPRO) 150 MG tablet (Taking) Take 1 tablet (150 mg total) by mouth daily.   irbesartan (AVAPRO) 150 MG tablet (Taking) Take 1 tablet (150 mg total) by mouth daily.   metoprolol tartrate (LOPRESSOR) 25 MG tablet (Taking) Take 1 tablet (25 mg total) by mouth 2 (two) times daily.

## 2023-03-23 NOTE — Assessment & Plan Note (Signed)
Checking new lipid panel and CMP, consider reducing the dose of crestor to help with the joint and muscle aches she is having

## 2023-03-23 NOTE — Assessment & Plan Note (Signed)
Patient continues on BB and statin, however she is complaining about weight gain and muscle aches, both of which are likely a result of her medications. I will order her annual labs and we discussed starting Wegovy for CAD with BMI >25. Pt is agreeable to starting this medication. I will start the process of getting it approved.

## 2023-03-26 ENCOUNTER — Telehealth: Payer: Self-pay | Admitting: Pharmacy Technician

## 2023-03-26 ENCOUNTER — Other Ambulatory Visit (HOSPITAL_COMMUNITY): Payer: Self-pay

## 2023-03-26 NOTE — Telephone Encounter (Signed)
Pharmacy Patient Advocate Encounter   Received notification from CoverMyMeds that prior authorization for [facility] 0.25MG /0.5ML auto-injectors is required/requested.   Insurance verification completed.   The patient is insured through  [employer]  .   Per test claim: PA required; PA submitted to [employer] via CoverMyMeds Key/confirmation #/EOC [id number] Status is pending

## 2023-03-27 ENCOUNTER — Encounter: Payer: Self-pay | Admitting: Family Medicine

## 2023-03-27 DIAGNOSIS — R9439 Abnormal result of other cardiovascular function study: Secondary | ICD-10-CM

## 2023-03-27 DIAGNOSIS — I10 Essential (primary) hypertension: Secondary | ICD-10-CM

## 2023-03-28 MED ORDER — METOPROLOL TARTRATE 25 MG PO TABS
25.0000 mg | ORAL_TABLET | Freq: Two times a day (BID) | ORAL | 1 refills | Status: DC
Start: 2023-03-28 — End: 2023-07-25

## 2023-03-28 NOTE — Telephone Encounter (Signed)
Pharmacy Patient Advocate Encounter  Received notification from  [employer]  that Prior Authorization for Wegovy 0.25mg /0.72ml has been DENIED. Please advise how you'd like to proceed. Full denial letter will be uploaded to the media tab. See denial reason below. Weight loss medication are an exclusion under you plan benefits and are not covered.    PA #/Case ID/Reference #: [id number]

## 2023-03-28 NOTE — Telephone Encounter (Signed)
Pt calling to check on progress. Says she was told an appeal and info is needed. Providing fax #[phone] for info

## 2023-03-30 ENCOUNTER — Other Ambulatory Visit: Payer: Self-pay | Admitting: Family Medicine

## 2023-03-30 DIAGNOSIS — I2510 Atherosclerotic heart disease of native coronary artery without angina pectoris: Secondary | ICD-10-CM

## 2023-03-30 DIAGNOSIS — Z6829 Body mass index (BMI) 29.0-29.9, adult: Secondary | ICD-10-CM

## 2023-04-02 NOTE — Telephone Encounter (Signed)
Noted  

## 2023-04-02 NOTE — Telephone Encounter (Signed)
Patient informed of the message below.

## 2023-04-02 NOTE — Telephone Encounter (Signed)
The medication was denied-- I spoke to the rep for wegovy and he said they haven't signed the contracts yet so it will not be available for CAD until 2025

## 2023-04-02 NOTE — Telephone Encounter (Signed)
Please see previous note-- we might try again in 2025

## 2023-04-11 ENCOUNTER — Telehealth: Payer: Self-pay

## 2023-04-11 NOTE — Telephone Encounter (Signed)
Patient called wanting to schedule an appointment for left side pain. Patient stated she was diagnosed with  Diverticulitis when she went to the hospital in [place]. Patient was traveling and had an appointment with [facility] in [place]. Requesting records for patient. Patient is wanting to establish care due to her PCP being with [facility].

## 2023-04-12 DIAGNOSIS — Z85820 Personal history of malignant melanoma of skin: Secondary | ICD-10-CM | POA: Diagnosis not present

## 2023-04-12 DIAGNOSIS — D485 Neoplasm of uncertain behavior of skin: Secondary | ICD-10-CM | POA: Diagnosis not present

## 2023-04-12 DIAGNOSIS — Z1283 Encounter for screening for malignant neoplasm of skin: Secondary | ICD-10-CM | POA: Diagnosis not present

## 2023-04-12 DIAGNOSIS — D2261 Melanocytic nevi of right upper limb, including shoulder: Secondary | ICD-10-CM | POA: Diagnosis not present

## 2023-04-12 DIAGNOSIS — Z08 Encounter for follow-up examination after completed treatment for malignant neoplasm: Secondary | ICD-10-CM | POA: Diagnosis not present

## 2023-04-12 DIAGNOSIS — D225 Melanocytic nevi of trunk: Secondary | ICD-10-CM | POA: Diagnosis not present

## 2023-04-16 NOTE — Telephone Encounter (Signed)
Good afternoon Dr. Barron Alvine  Records were received today for this patient to see [place] about diverticulitis. She has been requesting to transfer care because PCP is with [facility] and she needs to establish care with a new GI after her last one was in [place]. Records are in media on Epic. Please review and advise of scheduling. Thank you.

## 2023-04-18 NOTE — Telephone Encounter (Signed)
Left VM to call and schedule OV

## 2023-04-23 ENCOUNTER — Encounter: Payer: Self-pay | Admitting: Gastroenterology

## 2023-04-23 DIAGNOSIS — L988 Other specified disorders of the skin and subcutaneous tissue: Secondary | ICD-10-CM | POA: Diagnosis not present

## 2023-04-23 DIAGNOSIS — D485 Neoplasm of uncertain behavior of skin: Secondary | ICD-10-CM | POA: Diagnosis not present

## 2023-04-23 DIAGNOSIS — Z1283 Encounter for screening for malignant neoplasm of skin: Secondary | ICD-10-CM | POA: Diagnosis not present

## 2023-04-24 ENCOUNTER — Ambulatory Visit
Admission: RE | Admit: 2023-04-24 | Discharge: 2023-04-24 | Disposition: A | Discharge status: Home / Self Care | Payer: BC Managed Care – PPO | Source: Ambulatory Visit | Attending: Family Medicine | Admitting: Family Medicine

## 2023-04-24 DIAGNOSIS — Z1231 Encounter for screening mammogram for malignant neoplasm of breast: Secondary | ICD-10-CM

## 2023-06-22 ENCOUNTER — Other Ambulatory Visit (HOSPITAL_COMMUNITY): Payer: Self-pay

## 2023-06-22 ENCOUNTER — Telehealth: Payer: Self-pay | Admitting: Internal Medicine

## 2023-06-22 ENCOUNTER — Encounter: Payer: Self-pay | Admitting: Family Medicine

## 2023-06-22 DIAGNOSIS — Z13220 Encounter for screening for lipoid disorders: Secondary | ICD-10-CM

## 2023-06-22 MED ORDER — METOPROLOL TARTRATE 25 MG PO TABS
25.0000 mg | ORAL_TABLET | Freq: Two times a day (BID) | ORAL | 1 refills | Status: DC
  Filled 2023-06-22: qty 180, 90d supply, fill #0

## 2023-06-22 MED ORDER — ROSUVASTATIN CALCIUM 40 MG PO TABS
40.0000 mg | ORAL_TABLET | Freq: Every day | ORAL | 0 refills | Status: DC
  Filled 2023-06-22: qty 30, 30d supply, fill #0

## 2023-06-22 MED ORDER — TRIAMCINOLONE ACETONIDE 0.1 % EX CREA
1.0000 | TOPICAL_CREAM | Freq: Two times a day (BID) | CUTANEOUS | 5 refills | Status: DC
  Filled 2023-06-22: qty 30, 15d supply, fill #0

## 2023-06-22 NOTE — Telephone Encounter (Signed)
 RX sent to requested Pharmacy

## 2023-06-22 NOTE — Addendum Note (Signed)
Addended by: Adriana Simas, Keshawna Dix L on: 06/22/2023 10:45 AM   Modules accepted: Orders

## 2023-06-22 NOTE — Telephone Encounter (Signed)
*  STAT* If patient is at the pharmacy, call can be transferred to refill team.   1. Which medications need to be refilled? (please list name of each medication and dose if known)   rosuvastatin (CRESTOR) 40 MG tablet   2. Which pharmacy/location (including street and city if local pharmacy) is medication to be sent to? [facility] Phone: [phone]  Fax: [phone]      3. Do they need a 30 day or 90 day supply? 90

## 2023-06-25 ENCOUNTER — Other Ambulatory Visit (HOSPITAL_COMMUNITY): Payer: Self-pay

## 2023-06-29 ENCOUNTER — Other Ambulatory Visit (HOSPITAL_COMMUNITY): Payer: Self-pay

## 2023-07-09 ENCOUNTER — Other Ambulatory Visit (HOSPITAL_COMMUNITY): Payer: Self-pay

## 2023-07-10 ENCOUNTER — Other Ambulatory Visit (HOSPITAL_COMMUNITY): Payer: Self-pay

## 2023-07-14 DIAGNOSIS — H5212 Myopia, left eye: Secondary | ICD-10-CM | POA: Diagnosis not present

## 2023-07-25 ENCOUNTER — Ambulatory Visit: Payer: 59 | Admitting: Gastroenterology

## 2023-07-25 ENCOUNTER — Encounter: Payer: Self-pay | Admitting: Gastroenterology

## 2023-07-25 ENCOUNTER — Other Ambulatory Visit (INDEPENDENT_AMBULATORY_CARE_PROVIDER_SITE_OTHER): Payer: 59

## 2023-07-25 VITALS — BP 132/74 | HR 88 | Ht 64.0 in | Wt 164.0 lb

## 2023-07-25 DIAGNOSIS — K5790 Diverticulosis of intestine, part unspecified, without perforation or abscess without bleeding: Secondary | ICD-10-CM

## 2023-07-25 DIAGNOSIS — R1032 Left lower quadrant pain: Secondary | ICD-10-CM | POA: Diagnosis not present

## 2023-07-25 DIAGNOSIS — Z800 Family history of malignant neoplasm of digestive organs: Secondary | ICD-10-CM | POA: Diagnosis not present

## 2023-07-25 LAB — CBC
HCT: 43.6 % (ref 36.0–46.0)
Hemoglobin: 14.6 g/dL (ref 12.0–15.0)
MCHC: 33.4 g/dL (ref 30.0–36.0)
MCV: 87.8 fL (ref 78.0–100.0)
Platelets: 283 10*3/uL (ref 150.0–400.0)
RBC: 4.96 Mil/uL (ref 3.87–5.11)
RDW: 12.7 % (ref 11.5–15.5)
WBC: 7.1 10*3/uL (ref 4.0–10.5)

## 2023-07-25 LAB — C-REACTIVE PROTEIN: CRP: <1 mg/dL (ref 0.5–20.0)

## 2023-07-25 LAB — SEDIMENTATION RATE: Sed Rate: 13 mm/h (ref 0–30)

## 2023-07-25 NOTE — Patient Instructions (Signed)
_______________________________________________________  If your blood pressure at your visit was 140/90 or greater, please contact your primary care physician to follow up on this.  If you are age 53 or younger, your body mass index should be between 19-25. Your Body mass index is 28.15 kg/m. If this is out of the aformentioned range listed, please consider follow up with your Primary Care Provider.  ________________________________________________________  The [facility] providers would like to encourage you to use [facility] to communicate with providers for non-urgent requests or questions.  Due to long hold times on the telephone, sending your provider a message by [facility] may be a faster and more efficient way to get a response.  Please allow 48 business hours for a response.  Please remember that this is for non-urgent requests.  _______________________________________________________  Your provider has requested that you go to the basement level for lab work before leaving today. Press "B" on the elevator. The lab is located at the first door on the left as you exit the elevator.  Due to recent changes in healthcare laws, you may see the results of your imaging and laboratory studies on MyChart before your provider has had a chance to review them.  We understand that in some cases there may be results that are confusing or concerning to you. Not all laboratory results come back in the same time frame and the provider may be waiting for multiple results in order to interpret others.  Please give [place] 48 hours in order for your provider to thoroughly review all the results before contacting the office for clarification of your results.   It was a pleasure to see you today!  Vito Cirigliano, D.O.

## 2023-07-25 NOTE — Progress Notes (Unsigned)
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  First episode of diverticulitis was while in [place] traveling for work in 2022 and diagnosed with diverticulitis per patient, treated with Abx and resolved.  She had a subsequent colonoscopy in [place] in 01/2021 notable for pandiverticulosis as outlined below.  Reports having recurrence of similar symptoms while again traveling for work in [place] in 07/2022. Reviewed CT report from 07/24/2022 from [place], [place], [place]: Diverticulosis without diverticulitis, otherwise normal.  At that time, normal CBC.    She had follow-up with GI while there at [facility], and was prescribed something (cannot recall name) and told to f/u prn.  No records available for review.  She was previously taking Senna, Smooth Move, and other OTC laxatives to "keep things moving", but no history of constipation.  She has since stopped those under the recommendation of [facility].  Today, she reports a constant LLQ and left side/flank pain. Not related to PO intake, BM, etc. No fever. Lasts throughout the day.  Does not limit her from activities.  Symptoms have been present since 2022.   Has had 20# wt gain over the last year or so.   Endoscopic History: - 02/02/2021: Colonoscopy ([place]): Pandiverticulosis, internal hemorrhoids.  Repeat 10 years  Father died from Pancreatic CA at age 1. No known family history of CRC or IBD.     Past Medical History:  Diagnosis Date   Diverticulitis    Eczema    Endometrial polyp    History of Bell's palsy    2012 left side ,  resolved   History of hypertension     08-04-2019  per pt over 10 yrs took medication briefly , lost wt and watch diet , no more medication since   History of melanoma in situ    06/ 2019  top of right foot excision, localized   IDA (iron deficiency anemia)      Past Surgical History:  Procedure Laterality Date   CORONARY STENT INTERVENTION N/A 10/25/2021   Procedure: CORONARY STENT INTERVENTION;  Surgeon: Lennette Bihari, MD;  Location: [facility];  Service: Cardiovascular;  Laterality: N/A;   DILATATION & CURETTAGE/HYSTEROSCOPY WITH MYOSURE N/A 08/12/2019   Procedure: DILATATION & CURETTAGE/HYSTEROSCOPY WITH MYOSURE;  Surgeon: Genia Del, MD;  Location: [facility];  Service: Gynecology;  Laterality: N/A;  Request 7:30am OR time in [place] Gyn block requests 45 minutes   ESOPHAGOGASTRODUODENOSCOPY  2012 approx.   LEFT HEART CATH AND CORONARY ANGIOGRAPHY N/A 10/25/2021   Procedure: LEFT HEART CATH AND CORONARY ANGIOGRAPHY;  Surgeon: Lennette Bihari, MD;  Location: [facility];  Service: Cardiovascular;  Laterality: N/A;   ROBOTIC ASSISTED TOTAL HYSTERECTOMY WITH BILATERAL SALPINGO OOPHERECTOMY Bilateral 11/25/2019   Procedure: XI ROBOTIC ASSISTED TOTAL HYSTERECTOMY WITH BILATERAL SALPINGECTOMY;  Surgeon: Genia Del, MD;  Location: [facility];  Service: Gynecology;  Laterality: Bilateral;   Family History  Problem Relation Age of Onset   Pancreatic cancer Father    Cancer Father    Breast cancer Maternal Grandmother 7  Cancer Maternal Grandmother    Diabetes Mother        borderline   Alcohol abuse Mother    Arthritis Mother    Depression Mother    Hypertension Mother    Learning disabilities Mother    Alcohol abuse Maternal Grandfather    Eczema Brother    Hepatitis C Brother    Other Brother        suicide   Depression Brother    Drug abuse Brother    Early death Brother    Learning disabilities Brother    Social History   Tobacco Use   Smoking  status: Former    Current packs/day: 0.00    Average packs/day: 0.1 packs/day for 20.0 years (2.0 ttl pk-yrs)    Types: Cigarettes    Start date: 08/03/1994    Quit date: 08/03/2014    Years since quitting: 8.9   Smokeless tobacco: Never  Vaping Use   Vaping status: Never Used  Substance Use Topics   Alcohol use: Yes    Alcohol/week: 6.0 standard drinks of alcohol    Types: 3 Glasses of wine, 3 Cans of beer per week    Comment: ocaasionally   Drug use: Not Currently   Current Outpatient Medications  Medication Sig Dispense Refill   aspirin 81 MG EC tablet Take 1 tablet (81 mg total) by mouth daily. Swallow whole. 30 tablet 12   esomeprazole (NEXIUM) 20 MG capsule Take 20 mg by mouth daily as needed (heartburn).     irbesartan (AVAPRO) 150 MG tablet Take 1 tablet (150 mg total) by mouth daily. 90 tablet 1   metoprolol tartrate (LOPRESSOR) 25 MG tablet Take 1 tablet (25 mg total) by mouth 2 (two) times daily. 180 tablet 1   nitroGLYCERIN (NITROSTAT) 0.4 MG SL tablet Place 1 tablet (0.4 mg total) under the tongue every 5 (five) minutes as needed. 25 tablet 2   rosuvastatin (CRESTOR) 40 MG tablet Take 1 tablet (40 mg total) by mouth daily. Patient must schedule overdue appt with cardiology for more refills [phone] (Patient taking differently: Take 20 mg by mouth daily. Patient must schedule overdue appt with cardiology for more refills [phone]) 30 tablet 0   triamcinolone cream (KENALOG) 0.1 % Apply 1 Application topically 2 (two) times daily. 30 g 5   No current facility-administered medications for this visit.   No Known Allergies   Review of Systems: All systems reviewed and negative except where noted in HPI.     Physical Exam:    Wt Readings from Last 3 Encounters:  07/25/23 164 lb (74.4 kg)  03/23/23 171 lb (77.6 kg)  02/28/23 168 lb 9.6 oz (76.5 kg)    BP 132/74   Pulse 88   Ht 5\' 4"  (1.626 m)   Wt 164 lb (74.4 kg)   LMP 10/25/2019   BMI 28.15 kg/m   Constitutional:  Pleasant, in no acute distress. Psychiatric: Normal mood and affect. Behavior is normal. Cardiovascular: Normal rate, regular rhythm. No edema Pulmonary/chest: Effort normal and breath sounds normal. No wheezing, rales or rhonchi. Abdominal: Mild TTP in LLQ without rebound, guarding, peritoneal signs.  Soft, nondistended. Bowel sounds active throughout. There are no masses palpable. No hepatomegaly. Neurological: Alert and oriented to person place and time. Skin: Skin is warm and dry. No rashes noted.   ASSESSMENT AND PLAN;   1) LLQ Pain 2) Diverticulosis History of diverticulitis at least 1 episode in 2022 while traveling for work in [place].  Has had smoldering  LLQ pain since then.  Possible SCAD?  Discussed full DDx with plan as follows:  - Check CBC, ESR, CRP, fecal calprotectin - Discussed role/utility of repeat colonoscopy now.  Given stability of symptoms and lack of diverticulitis recurrence on most recent CT, she would like to hold off and I think that is reasonable. - Depending on lab results and ongoing symptomatology, may benefit from trial of Bentyl or possibly trial of 5-ASA therapy -RTC in 3-4 months or sooner as needed  3) Family history of Pancreatic Cancer Father with pancreatic cancer, diagnosed in his 5s.  No previous genetic testing.  Can discuss this further at follow-up regarding referral to the genetics Clinic if she would like.   Shellia Cleverly, DO, FACG  07/25/2023, 11:25 AM   Karie Georges, MD

## 2023-07-26 ENCOUNTER — Other Ambulatory Visit: Payer: 59

## 2023-07-26 DIAGNOSIS — K5790 Diverticulosis of intestine, part unspecified, without perforation or abscess without bleeding: Secondary | ICD-10-CM

## 2023-07-26 DIAGNOSIS — R1032 Left lower quadrant pain: Secondary | ICD-10-CM

## 2023-07-28 LAB — CALPROTECTIN, FECAL: Calprotectin, Fecal: 6 ug/g (ref 0–120)

## 2023-08-27 ENCOUNTER — Encounter: Payer: Self-pay | Admitting: Family Medicine

## 2023-08-28 ENCOUNTER — Other Ambulatory Visit: Payer: Self-pay | Admitting: Internal Medicine

## 2023-08-28 ENCOUNTER — Other Ambulatory Visit (HOSPITAL_COMMUNITY): Payer: Self-pay

## 2023-08-28 DIAGNOSIS — Z13220 Encounter for screening for lipoid disorders: Secondary | ICD-10-CM

## 2023-08-29 ENCOUNTER — Other Ambulatory Visit (HOSPITAL_COMMUNITY): Payer: Self-pay

## 2023-08-29 MED ORDER — ROSUVASTATIN CALCIUM 40 MG PO TABS
40.0000 mg | ORAL_TABLET | Freq: Every day | ORAL | 0 refills | Status: DC
  Filled 2023-08-29: qty 15, 15d supply, fill #0

## 2023-09-28 ENCOUNTER — Other Ambulatory Visit: Payer: Self-pay | Admitting: Internal Medicine

## 2023-09-28 ENCOUNTER — Other Ambulatory Visit (HOSPITAL_COMMUNITY): Payer: Self-pay

## 2023-09-28 DIAGNOSIS — Z13220 Encounter for screening for lipoid disorders: Secondary | ICD-10-CM

## 2023-09-28 MED ORDER — ROSUVASTATIN CALCIUM 40 MG PO TABS
40.0000 mg | ORAL_TABLET | Freq: Every day | ORAL | 0 refills | Status: DC
  Filled 2023-09-28: qty 15, 15d supply, fill #0

## 2023-10-04 ENCOUNTER — Other Ambulatory Visit (HOSPITAL_BASED_OUTPATIENT_CLINIC_OR_DEPARTMENT_OTHER): Payer: Self-pay

## 2023-10-04 ENCOUNTER — Other Ambulatory Visit (HOSPITAL_COMMUNITY): Payer: Self-pay

## 2023-10-04 ENCOUNTER — Encounter: Payer: Self-pay | Admitting: Family Medicine

## 2023-10-04 ENCOUNTER — Ambulatory Visit (INDEPENDENT_AMBULATORY_CARE_PROVIDER_SITE_OTHER): Payer: 59 | Admitting: Family Medicine

## 2023-10-04 VITALS — BP 128/80 | HR 52 | Temp 98.5°F | Ht 65.0 in | Wt 175.8 lb

## 2023-10-04 DIAGNOSIS — I2510 Atherosclerotic heart disease of native coronary artery without angina pectoris: Secondary | ICD-10-CM | POA: Diagnosis not present

## 2023-10-04 DIAGNOSIS — Z0000 Encounter for general adult medical examination without abnormal findings: Secondary | ICD-10-CM | POA: Diagnosis not present

## 2023-10-04 DIAGNOSIS — Z13220 Encounter for screening for lipoid disorders: Secondary | ICD-10-CM | POA: Diagnosis not present

## 2023-10-04 DIAGNOSIS — I10 Essential (primary) hypertension: Secondary | ICD-10-CM | POA: Diagnosis not present

## 2023-10-04 DIAGNOSIS — M791 Myalgia, unspecified site: Secondary | ICD-10-CM

## 2023-10-04 LAB — COMPREHENSIVE METABOLIC PANEL
ALT: 22 U/L (ref 0–35)
AST: 17 U/L (ref 0–37)
Albumin: 4.6 g/dL (ref 3.5–5.2)
Alkaline Phosphatase: 74 U/L (ref 39–117)
BUN: 14 mg/dL (ref 6–23)
CO2: 28 meq/L (ref 19–32)
Calcium: 9.7 mg/dL (ref 8.4–10.5)
Chloride: 102 meq/L (ref 96–112)
Creatinine, Ser: 0.93 mg/dL (ref 0.40–1.20)
GFR: 70.16 mL/min (ref >60.00)
Glucose, Bld: 113 mg/dL — ABNORMAL HIGH (ref 70–99)
Potassium: 4.1 meq/L (ref 3.5–5.1)
Sodium: 138 meq/L (ref 135–145)
Total Bilirubin: 0.9 mg/dL (ref 0.2–1.2)
Total Protein: 7.4 g/dL (ref 6.0–8.3)

## 2023-10-04 LAB — LIPID PANEL
Cholesterol: 144 mg/dL (ref 0–200)
HDL: 53.9 mg/dL (ref >39.00)
LDL Cholesterol: 70 mg/dL (ref 0–99)
NonHDL: 90.47
Total CHOL/HDL Ratio: 3
Triglycerides: 101 mg/dL (ref 0.0–149.0)
VLDL: 20.2 mg/dL (ref 0.0–40.0)

## 2023-10-04 LAB — CK: Total CK: 49 U/L (ref 7–177)

## 2023-10-04 MED ORDER — IRBESARTAN 150 MG PO TABS
150.0000 mg | ORAL_TABLET | Freq: Every day | ORAL | 1 refills | Status: DC
  Filled 2023-10-04: qty 90, 90d supply, fill #0
  Filled 2024-01-11: qty 90, 90d supply, fill #1

## 2023-10-04 MED ORDER — ROSUVASTATIN CALCIUM 20 MG PO TABS
20.0000 mg | ORAL_TABLET | Freq: Every day | ORAL | 1 refills | Status: DC
  Filled 2023-10-04: qty 90, 90d supply, fill #0

## 2023-10-04 MED ORDER — WEGOVY 0.25 MG/0.5ML ~~LOC~~ SOAJ
SUBCUTANEOUS | 5 refills | Status: DC
  Filled 2023-10-04: qty 2, 28d supply, fill #0

## 2023-10-04 NOTE — Patient Instructions (Addendum)
 Dr. Rennis Golden -- [phone]    Health Maintenance, Female Adopting a healthy lifestyle and getting preventive care are important in promoting health and wellness. Ask your health care provider about: The right schedule for you to have regular tests and exams. Things you can do on your own to prevent diseases and keep yourself healthy. What should I know about diet, weight, and exercise? Eat a healthy diet  Eat a diet that includes plenty of vegetables, fruits, low-fat dairy products, and lean protein. Do not eat a lot of foods that are high in solid fats, added sugars, or sodium. Maintain a healthy weight Body mass index (BMI) is used to identify weight problems. It estimates body fat based on height and weight. Your health care provider can help determine your BMI and help you achieve or maintain a healthy weight. Get regular exercise Get regular exercise. This is one of the most important things you can do for your health. Most adults should: Exercise for at least 150 minutes each week. The exercise should increase your heart rate and make you sweat (moderate-intensity exercise). Do strengthening exercises at least twice a week. This is in addition to the moderate-intensity exercise. Spend less time sitting. Even light physical activity can be beneficial. Watch cholesterol and blood lipids Have your blood tested for lipids and cholesterol at 54 years of age, then have this test every 5 years. Have your cholesterol levels checked more often if: Your lipid or cholesterol levels are high. You are older than 54 years of age. You are at high risk for heart disease. What should I know about cancer screening? Depending on your health history and family history, you may need to have cancer screening at various ages. This may include screening for: Breast cancer. Cervical cancer. Colorectal cancer. Skin cancer. Lung cancer. What should I know about heart disease, diabetes, and high blood  pressure? Blood pressure and heart disease High blood pressure causes heart disease and increases the risk of stroke. This is more likely to develop in people who have high blood pressure readings or are overweight. Have your blood pressure checked: Every 3-5 years if you are 31-3 years of age. Every year if you are 76 years old or older. Diabetes Have regular diabetes screenings. This checks your fasting blood sugar level. Have the screening done: Once every three years after age 70 if you are at a normal weight and have a low risk for diabetes. More often and at a younger age if you are overweight or have a high risk for diabetes. What should I know about preventing infection? Hepatitis B If you have a higher risk for hepatitis B, you should be screened for this virus. Talk with your health care provider to find out if you are at risk for hepatitis B infection. Hepatitis C Testing is recommended for: Everyone born from [birth year] through 1966. Anyone with known risk factors for hepatitis C. Sexually transmitted infections (STIs) Get screened for STIs, including gonorrhea and chlamydia, if: You are sexually active and are younger than 55 years of age. You are older than 54 years of age and your health care provider tells you that you are at risk for this type of infection. Your sexual activity has changed since you were last screened, and you are at increased risk for chlamydia or gonorrhea. Ask your health care provider if you are at risk. Ask your health care provider about whether you are at high risk for HIV. Your health care provider may recommend  a prescription medicine to help prevent HIV infection. If you choose to take medicine to prevent HIV, you should first get tested for HIV. You should then be tested every 3 months for as long as you are taking the medicine. Pregnancy If you are about to stop having your period (premenopausal) and you may become pregnant, seek counseling before you  get pregnant. Take 400 to 800 micrograms (mcg) of folic acid every day if you become pregnant. Ask for birth control (contraception) if you want to prevent pregnancy. Osteoporosis and menopause Osteoporosis is a disease in which the bones lose minerals and strength with aging. This can result in bone fractures. If you are 71 years old or older, or if you are at risk for osteoporosis and fractures, ask your health care provider if you should: Be screened for bone loss. Take a calcium or vitamin D supplement to lower your risk of fractures. Be given hormone replacement therapy (HRT) to treat symptoms of menopause. Follow these instructions at home: Alcohol use Do not drink alcohol if: Your health care provider tells you not to drink. You are pregnant, may be pregnant, or are planning to become pregnant. If you drink alcohol: Limit how much you have to: 0-1 drink a day. Know how much alcohol is in your drink. In the [place] one drink equals one 12 oz bottle of beer (355 mL), one 5 oz glass of wine (148 mL), or one 1 oz glass of hard liquor (44 mL). Lifestyle Do not use any products that contain nicotine or tobacco. These products include cigarettes, chewing tobacco, and vaping devices, such as e-cigarettes. If you need help quitting, ask your health care provider. Do not use street drugs. Do not share needles. Ask your health care provider for help if you need support or information about quitting drugs. General instructions Schedule regular health, dental, and eye exams. Stay current with your vaccines. Tell your health care provider if: You often feel depressed. You have ever been abused or do not feel safe at home. Summary Adopting a healthy lifestyle and getting preventive care are important in promoting health and wellness. Follow your health care provider's instructions about healthy diet, exercising, and getting tested or screened for diseases. Follow your health care provider's  instructions on monitoring your cholesterol and blood pressure. This information is not intended to replace advice given to you by your health care provider. Make sure you discuss any questions you have with your health care provider. Document Revised: 12/13/2020 Document Reviewed: 12/13/2020 Elsevier Patient Education  2024 [employer].

## 2023-10-04 NOTE — Progress Notes (Signed)
 Complete physical exam  Patient: Megan Kelley   DOB: 06/11/1971   53 y.o. Female  MRN: [id number]  Subjective:    Chief Complaint  Patient presents with   Annual Exam    Megan Kelley is a 53 y.o. female who presents today for a complete physical exam. She reports consuming a general diet. Home exercise routine includes strength training and cardio 3 days a week. She generally feels well. She reports sleeping fairly well. She does not have additional problems to discuss today.    Most recent fall risk assessment:    10/21/2021   10:33 AM  Fall Risk   Falls in the past year? 1  Number falls in past yr: 1  Risk for fall due to : No Fall Risks  Follow up Falls evaluation completed     Most recent depression screenings:    03/23/2023    8:00 AM 10/21/2021   10:34 AM  PHQ 2/9 Scores  PHQ - 2 Score 0 2  PHQ- 9 Score 1 2    Vision:Within last year and Dental: No current dental problems and Receives regular dental care  Patient Active Problem List   Diagnosis Date Noted   Eczema 03/23/2023   CAD (coronary artery disease) 10/25/2021   Hyperlipidemia 10/25/2021   Hypertension 10/25/2021   Melanoma of skin (HCC) 01/17/2019      Patient Care Team: Karie Georges, MD as PCP - General (Family Medicine) Chrystie Nose, MD as PCP - Cardiology (Cardiology)   Outpatient Medications Prior to Visit  Medication Sig   aspirin 81 MG EC tablet Take 1 tablet (81 mg total) by mouth daily. Swallow whole.   esomeprazole (NEXIUM) 20 MG capsule Take 20 mg by mouth daily as needed (heartburn).   metoprolol tartrate (LOPRESSOR) 25 MG tablet Take 1 tablet (25 mg total) by mouth 2 (two) times daily.   nitroGLYCERIN (NITROSTAT) 0.4 MG SL tablet Place 1 tablet (0.4 mg total) under the tongue every 5 (five) minutes as needed.   triamcinolone cream (KENALOG) 0.1 % Apply 1 Application topically 2 (two) times daily.   [DISCONTINUED] irbesartan (AVAPRO) 150 MG tablet Take 1 tablet  (150 mg total) by mouth daily.   [DISCONTINUED] rosuvastatin (CRESTOR) 40 MG tablet Take 1 tablet (40 mg total) by mouth daily.   No facility-administered medications prior to visit.    Review of Systems  HENT:  Negative for hearing loss.   Eyes:  Negative for blurred vision.  Respiratory:  Negative for shortness of breath.   Cardiovascular:  Negative for chest pain.  Gastrointestinal: Negative.   Genitourinary: Negative.   Musculoskeletal:  Positive for joint pain.  Neurological:  Negative for headaches.  Psychiatric/Behavioral:  Negative for depression.        Objective:     BP 128/80   Pulse (!) 52   Temp 98.5 F (36.9 C) (Oral)   Ht 5\' 5"  (1.651 m)   Wt 175 lb 12.8 oz (79.7 kg)   LMP 10/25/2019   SpO2 98%   BMI 29.25 kg/m    Physical Exam Vitals reviewed.  Constitutional:      Appearance: Normal appearance. She is well-groomed and normal weight.  HENT:     Right Ear: Tympanic membrane and ear canal normal.     Left Ear: Tympanic membrane and ear canal normal.     Mouth/Throat:     Mouth: Mucous membranes are moist.     Pharynx: No posterior oropharyngeal erythema.  Eyes:  Conjunctiva/sclera: Conjunctivae normal.  Neck:     Thyroid: No thyromegaly.  Cardiovascular:     Rate and Rhythm: Normal rate and regular rhythm.     Pulses: Normal pulses.     Heart sounds: S1 normal and S2 normal.  Pulmonary:     Effort: Pulmonary effort is normal.     Breath sounds: Normal breath sounds and air entry.  Abdominal:     General: Abdomen is flat. Bowel sounds are normal.     Palpations: Abdomen is soft.  Musculoskeletal:     Right lower leg: No edema.     Left lower leg: No edema.  Lymphadenopathy:     Cervical: No cervical adenopathy.  Neurological:     Mental Status: She is alert and oriented to person, place, and time. Mental status is at baseline.     Gait: Gait is intact.  Psychiatric:        Mood and Affect: Mood and affect normal.        Speech:  Speech normal.        Behavior: Behavior normal.        Judgment: Judgment normal.      No results found for any visits on 10/04/23.     Assessment & Plan:    Routine Health Maintenance and Physical Exam  Immunization History  Administered Date(s) Administered   Influenza-Unspecified 06/23/2021   PFIZER(Purple Top)SARS-COV-2 Vaccination 10/14/2019, 11/11/2019   Tdap 08/07/2020   Zoster Recombinant(Shingrix) 07/22/2021, 02/05/2023    Health Maintenance  Topic Date Due   Pneumococcal Vaccine 69-72 Years old (1 of 2 - PCV) Never done   COVID-19 Vaccine (3 - Pfizer risk series) 12/09/2019   INFLUENZA VACCINE  11/05/2023 (Originally 03/08/2023)   MAMMOGRAM  04/23/2025   DTaP/Tdap/Td (2 - Td or Tdap) 08/07/2030   Colonoscopy  02/03/2031   Hepatitis C Screening  Completed   HIV Screening  Completed   Zoster Vaccines- Shingrix  Completed   HPV VACCINES  Aged Out    Discussed health benefits of physical activity, and encouraged her to engage in regular exercise appropriate for her age and condition.  Coronary artery disease involving native coronary artery of native heart without angina pectoris -     Lipid panel; Future -     Wegovy; Inject 0.25 mg into the skin once a week for 28 days, THEN 0.5 mg once a week for 28 days. BMI is 29, pt has documented CAD with stent placement in 2023.[name] Kitchen  Dispense: 2 mL; Refill: 5  Lipid screening -     Rosuvastatin Calcium; Take 1 tablet (20 mg total) by mouth daily.  Dispense: 90 tablet; Refill: 1  Primary hypertension -     Irbesartan; Take 1 tablet (150 mg total) by mouth daily.  Dispense: 90 tablet; Refill: 1 -     Comprehensive metabolic panel; Future  Muscle pain -     CK; Future  Routine general medical examination at a health care facility  Normal physical exam findings today except for joint pain which she reported in the ROS. She is on a statin medication and the joint pain could be related, will get CK to rule out muscle  breakdown. Counseled patient about stress management and healthy eating/ exercise, handouts given.   Return in 6 months (on 04/02/2024).     Karie Georges, MD

## 2023-10-05 ENCOUNTER — Encounter: Payer: Self-pay | Admitting: Family Medicine

## 2023-10-16 ENCOUNTER — Other Ambulatory Visit: Payer: Self-pay | Admitting: Family Medicine

## 2023-10-16 ENCOUNTER — Other Ambulatory Visit (HOSPITAL_COMMUNITY): Payer: Self-pay

## 2023-10-16 MED ORDER — METOPROLOL TARTRATE 25 MG PO TABS
25.0000 mg | ORAL_TABLET | Freq: Two times a day (BID) | ORAL | 1 refills | Status: DC
  Filled 2023-10-16: qty 180, 90d supply, fill #0
  Filled 2024-01-24: qty 180, 90d supply, fill #1

## 2023-10-17 ENCOUNTER — Telehealth: Payer: Self-pay

## 2023-10-17 NOTE — Telephone Encounter (Signed)
 Copied from CRM [id number]. Topic: Referral - Request for Referral >> Oct 17, 2023 10:52 AM Megan Kelley wrote: Did the patient discuss referral with their provider in the last year? no (If No - schedule appointment) (If Yes - send message)  Appointment offered? Yes  Type of order/referral and detailed reason for visit: Dermatologist , spots on leg.  Preference of office, provider, location: [facility] [address] [place] [phone]  If referral order, have you been seen by this specialty before? Yes, other skin issue (If Yes, this issue or another issue? When? Where?  Can we respond through MyChart? Yes

## 2023-10-17 NOTE — Telephone Encounter (Signed)
 Spoke with the patient and informed her a visit is needed for evaluation prior to placing a referral.  Patient stated she was seen for a spot on her leg and was informed there are no notes in the chart with this information.  Patient was informed a visit is needed, offered an appt and she hung up.

## 2023-10-30 DIAGNOSIS — L400 Psoriasis vulgaris: Secondary | ICD-10-CM | POA: Diagnosis not present

## 2023-10-31 ENCOUNTER — Other Ambulatory Visit (HOSPITAL_COMMUNITY): Payer: Self-pay

## 2023-10-31 MED ORDER — BETAMETHASONE DIPROPIONATE 0.05 % EX CREA
TOPICAL_CREAM | CUTANEOUS | 3 refills | Status: AC
Start: 2023-10-30 — End: ?
  Filled 2023-10-31: qty 45, 30d supply, fill #0
  Filled 2024-06-23: qty 45, 30d supply, fill #1

## 2023-11-05 ENCOUNTER — Encounter (HOSPITAL_BASED_OUTPATIENT_CLINIC_OR_DEPARTMENT_OTHER): Payer: Self-pay

## 2023-11-06 NOTE — Progress Notes (Unsigned)
 Cardiology Office Note    Date:  11/08/2023  ID:  Megan Kelley, Megan Kelley [birth year], MRN [id number] PCP:  Karie Georges, MD  Cardiologist:  Chrystie Nose, MD  Electrophysiologist:  None   Chief Complaint: Follow up for CAD   History of Present Illness: .    Megan Kelley is a 53 y.o. female with visit-pertinent history of hypertension, hyperlipidemia and CAD.  Patient was initially referred to cardiology service for shortness of breath with exertion and fatigue.  She also had labile blood pressures.  She underwent a echo stress test.  Echo demonstrated mild hypokinesis of the anterior septum and stress test was concerning for LAD territory ischemia.  Patient was markedly hypertensive with systolic blood pressure over 200 this time.  There was horizontal ST segment depression up to 1 mm noted in the inferior and lateral leads.  EF was 60%.  Patient underwent cardiac catheterization in 10/2021 which demonstrated 35% proximal LAD lesion followed by 80% mid LAD lesion, both area of treated with a single 3.0 x 22 mm resolute Onyx frontier stent.  First procedure patient had no residual disease.  Crestor was upgraded to 40 mg daily.  She was discharged on aspirin and Plavix.  Patient was last seen in follow-up on 05/01/2022.  She remained stable from a cardiac standpoint.  She did report when she been on vacation she missed some nighttime doses of Brilinta.  Patient was asked to monitor her blood pressure for the next 2 weeks and notify the office with potential plans to switch from losartan to irbesartan.  Today patient presents for follow-up.  She reports that she is overall doing well. She notes recent skin irritation problems with spots showing on her legs, she has been followed by dermatology and has started  on topicals with improvement. She also notes increased joint and muscle pain, specifically with exercise, she also feels her muscles are overall weaker, questions if related to  statin. She notes she was previously on Rosuvastatin 40 mg nightly, she had a reduction in dose and started taking in the morning with some improvement however continues to note muscle fatigue. She also reports increase in weight gain starting around August of last year, notes she has been in discussion with her PCP regarding starting of Wegovy.   Labwork independently reviewed: 10/04/2023: Sodium 138, potassium 4.1, creatinine 0.93, AST 17, ALT 22 ROS: .   Today she denies shortness of breath, lower extremity edema, fatigue, palpitations, melena, hematuria, hemoptysis, diaphoresis, weakness, presyncope, syncope, orthopnea, and PND.  All other systems are reviewed and otherwise negative. Studies Reviewed: [name] Kitchen   EKG:  EKG is ordered today, personally reviewed, demonstrating  EKG Interpretation Date/Time:  Wednesday November 07 2023 15:54:02 EDT Ventricular Rate:  68 PR Interval:  164 QRS Duration:  86 QT Interval:  426 QTC Calculation: 452 R Axis:   9  Text Interpretation: Normal sinus rhythm Nonspecific T wave abnormality Confirmed by Reather Littler [phone]) on 11/08/2023 4:56:03 PM   CV Studies: Cardiac studies reviewed are outlined and summarized above. Otherwise please see EMR for full report. Cardiac Studies & Procedures   ______________________________________________________________________________________________ CARDIAC CATHETERIZATION  CARDIAC CATHETERIZATION 10/25/2021  Narrative   Mid LAD lesion is 80% stenosed.   Prox LAD to Mid LAD lesion is 35% stenosed.   A drug-eluting stent was successfully placed.   Post intervention, there is a 0% residual stenosis.   Post intervention, there is a 0% residual stenosis.  Single-vessel coronary artery disease with  80% eccentric LAD stenosis between the first 2 diagonal vessels.  Normal left circumflex and dominant RCA.  LVEDP 19 mmHg  Successful PCI to the LAD with ultimate insertion of a 3.0 x 22 mm Resolute Onyx Frontier stent postdilated  to 3.5 mm with the stenoses being reduced to 0%.  RECOMMENDATION: DAPT for minimum of 6 months but preferably a year.  The patient was just started on metoprolol succinate and rosuvastatin 20 mg.  Target LDL less than 70, consider titration of rosuvastatin to 40 mg.  We will plan for same-day discharge later today if patient remains stable and follow-up with Dr. Rennis Golden.  Findings Coronary Findings Diagnostic  Dominance: Right  Left Anterior Descending Prox LAD to Mid LAD lesion is 35% stenosed. Mid LAD lesion is 80% stenosed.  First Diagonal Branch Vessel is small in size.  Left Circumflex  First Obtuse Marginal Branch Vessel is small in size.  Second Obtuse Marginal Branch Vessel is small in size.  Intervention  Prox LAD to Mid LAD lesion Stent (Also treats lesions: Mid LAD) Pre-stent angioplasty was performed. A drug-eluting stent was successfully placed. Stent strut is well apposed. Post-stent angioplasty was performed. Post-Intervention Lesion Assessment The intervention was successful. Intentional subintimal strategy was used. Pre-interventional TIMI flow is 3. Post-intervention TIMI flow is 3. No complications occurred at this lesion. There is a 0% residual stenosis post intervention.  Mid LAD lesion Stent (Also treats lesions: Prox LAD to Mid LAD) See details in Prox LAD to Mid LAD lesion. Post-Intervention Lesion Assessment The intervention was successful. Pre-interventional TIMI flow is 3. Post-intervention TIMI flow is 3. No complications occurred at this lesion. There is a 0% residual stenosis post intervention.   STRESS TESTS  ECHOCARDIOGRAM STRESS TEST 10/19/2021  Narrative EXERCISE STRESS ECHO REPORT   --------------------------------------------------------------------------------  Patient Name:   Megan Kelley Date of Exam: 10/19/2021 Medical Rec #:  [id number]          Height:       64.0 in Accession #:    [id number]         Weight:       160.9  lb Date of Birth:  [birth year]          BSA:          1.784 m Patient Age:    50 years           BP:           149/97 mmHg Patient Gender: F                  HR:           70 bpm. Exam Location:  [address]  Procedure: Limited Echo, Stress Echo, Cardiac Doppler, Color Doppler and Intracardiac Opacification Agent  Indications:    R06.02 SOB  History:        Patient has no prior history of Echocardiogram examinations.  Sonographer:    Thurman Coyer RDCS Referring Phys: Wynn Banker  IMPRESSIONS   1. Grade 1 diastolic dysfunction. 2. Mild hypokinesis of the anteroseptum with stress concerning for ischemia. 3. This is a positive stress echocardiogram for ischemia (see scoring diagram/findings for description), in the distribution of the left anterior descending artery. 4. This is an intermediate risk study.  FINDINGS  Exam Protocol: The patient exercised on a treadmill according to a Bruce protocol. Definity contrast agent was given IV to delineate the left ventricular endocardial borders.   Patient Performance: The patient exercised for 7  minutes and 15 seconds, achieving 8.9 METS. The maximum stage achieved was II of the Bruce protocol. The baseline heart rate was 70 bpm. The heart rate at peak stress was 164 bpm. The target heart rate was calculated to be 143 bpm. The percentage of maximum predicted heart rate achieved was 97.4 %. The baseline blood pressure was 149/97 mmHg. The blood pressure at peak stress was 245/80 mmHg. The blood pressure response was hypertensive. The patient developed Hypertension during the stress exam. The patient's functional capacity was average.  EKG: Resting EKG showed normal sinus rhythm with no abnormal findings. The patient developed ST-T segment changes during exercise. There was 1.0 mm of horizontal ST segment depression in lead(s) II, III, AVF, V4, V5 and V6.   2D Echo Findings: The baseline ejection fraction was 60%. The peak  ejection fraction at stress was 55%. Baseline regional wall motion abnormalities were not present. This is a positive stress echocardiogram for ischemia, in the distribution of the left anterior descending artery. This is a positive stress echocardiogram for ischemia.  Stress Doppler:  Mitral Regurgitation: At baseline, trivial mitral regurgitation was present.   Chilton Si MD Electronically signed on 10/19/2021 at 11:15:39 PM     Final            ______________________________________________________________________________________________       Current Reported Medications:.    Current Meds  Medication Sig   aspirin 81 MG EC tablet Take 1 tablet (81 mg total) by mouth daily. Swallow whole.   betamethasone dipropionate 0.05 % cream Apply to affected area up to twice daily as needed. Not to face, groin or underarms.   esomeprazole (NEXIUM) 20 MG capsule Take 20 mg by mouth daily as needed (heartburn).   irbesartan (AVAPRO) 150 MG tablet Take 1 tablet (150 mg total) by mouth daily.   metoprolol tartrate (LOPRESSOR) 25 MG tablet Take 1 tablet (25 mg total) by mouth 2 (two) times daily.   nitroGLYCERIN (NITROSTAT) 0.4 MG SL tablet Place 1 tablet (0.4 mg total) under the tongue every 5 (five) minutes as needed.   rosuvastatin (CRESTOR) 20 MG tablet Take 1 tablet (20 mg total) by mouth daily.   triamcinolone cream (KENALOG) 0.1 % Apply 1 Application topically 2 (two) times daily.   Physical Exam:    VS:  BP 130/70 (BP Location: Right Arm)   Pulse 68   Ht 5\' 4"  (1.626 m)   Wt 180 lb (81.6 kg)   LMP 10/25/2019   SpO2 94%   BMI 30.90 kg/m    Wt Readings from Last 3 Encounters:  11/07/23 180 lb (81.6 kg)  10/04/23 175 lb 12.8 oz (79.7 kg)  07/25/23 164 lb (74.4 kg)    GEN: Well nourished, well developed in no acute distress NECK: No JVD; No carotid bruits CARDIAC: RRR, no murmurs, rubs, gallops RESPIRATORY:  Clear to auscultation without rales, wheezing or rhonchi   ABDOMEN: Soft, non-tender, non-distended EXTREMITIES:  No edema; No acute deformity     Asessement and Plan:.    CAD: S/p DES to proximal LAD in March 2023.  Patient notes that she is overall doing well.  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  She regularly exercises at least 3 times a week with cardio and weightlifting at home, she denies any chest discomfort while exercising.  Given her discomfort occurs after moving  patients, feel this is likely musculoskeletal in nature.  Encourage patient to monitor for chest discomfort or increased shortness of breath when doing her home exercise routine.  Patient also notes that she has had continuous weight gain in recent months despite increased exercise, discussed GLP-1 agonist which given her history of CAD, hyperlipidemia and hypertension would be beneficial to patient.  Reviewed ED precautions. Heart healthy diet and regular cardiovascular exercise encouraged.  Continue aspirin 81 mg daily, irbesartan 150 mg daily, metoprolol tartrate 25 mg twice daily.   Hypertension: Blood pressure today 130/70.  Continue current antihypertensive regimen.  Hyperlipidemia: Last lipid profile on 10/04/2023 indicated total cholesterol 144, HDL 53.9, triglycerides 101 and LDL 70.  Notes difficulty with sleeping and muscle pain.  Will have patient try a statin vacation, she will hold her rosuvastatin for 3 weeks.  She will then notify office if she has had improvement in muscle pain and weakness, if improvement will trial atorvastatin if myalgia persists will refer to Pharm.D. lipid clinic.  If she has no change in symptoms would recommend following up with PCP and resuming rosuvastatin.    Disposition: F/u with Reather Littler, NP in two months or sooner if needed.   Signed, Rip Harbour, NP

## 2023-11-07 ENCOUNTER — Ambulatory Visit: Attending: Cardiology | Admitting: Cardiology

## 2023-11-07 VITALS — BP 130/70 | HR 68 | Ht 64.0 in | Wt 180.0 lb

## 2023-11-07 DIAGNOSIS — E785 Hyperlipidemia, unspecified: Secondary | ICD-10-CM | POA: Diagnosis not present

## 2023-11-07 DIAGNOSIS — I2510 Atherosclerotic heart disease of native coronary artery without angina pectoris: Secondary | ICD-10-CM

## 2023-11-07 DIAGNOSIS — I10 Essential (primary) hypertension: Secondary | ICD-10-CM

## 2023-11-07 NOTE — Patient Instructions (Signed)
 Medication Instructions:  Statin vacation: Stop Crestor for 3 weeks. Please send an update of symptoms afterwards via MyChart.  *If you need a refill on your cardiac medications before your next appointment, please call your pharmacy*   Follow-Up: At [facility], you and your health needs are our priority.  As part of our continuing mission to provide you with exceptional heart care, our providers are all part of one team.  This team includes your primary Cardiologist (physician) and Advanced Practice Providers or APPs (Physician Assistants and Nurse Practitioners) who all work together to provide you with the care you need, when you need it.  Your next appointment:   2 month(s)  Provider:   Reather Littler, NP then, Chrystie Nose, MD will plan to see you again in 6 month(s).   Other Instructions:   1st Floor: - Lobby - Registration  - Pharmacy  - Lab - Cafe  2nd Floor: - PV Lab - Diagnostic Testing (echo, CT, nuclear med)  3rd Floor: - Vacant  4th Floor: - TCTS (cardiothoracic surgery) - AFib Clinic - Structural Heart Clinic - Vascular Surgery  - Vascular Ultrasound  5th Floor: - [facility] (general and EP) - Clinical Pharmacy for coumadin, hypertension, lipid, weight-loss medications, and med management appointments    Valet parking services will be available as well.

## 2023-11-08 ENCOUNTER — Encounter: Payer: Self-pay | Admitting: Cardiology

## 2024-01-01 ENCOUNTER — Telehealth: Payer: Self-pay | Admitting: Internal Medicine

## 2024-01-01 DIAGNOSIS — E785 Hyperlipidemia, unspecified: Secondary | ICD-10-CM

## 2024-01-01 NOTE — Telephone Encounter (Signed)
  Pt c/o medication issue:  1. Name of Medication: rosuvastatin  (CRESTOR ) 20 MG tablet   2. How are you currently taking this medication (dosage and times per day)?    Take 1 tablet (20 mg total) by mouth daily.    3. Are you having a reaction (difficulty breathing--STAT)? Severe muscle pain and weakness  4. What is your medication issue?  I am having severe muscles pain and weakness. I had a stain vacation in April,I believe and thought that I could live with the aches but it has gotten worse. I need an alternative

## 2024-01-01 NOTE — Telephone Encounter (Signed)
 Spoke with pt, she did the statin holiday and was not sure if her symptoms improved or not so she restarted the rosuvastatin  and now she is really having bad muscle pain. Okay given for the patient to stop the rosuvastatin  and will forward the message to dr hilty and caitlin walker for recommendations.

## 2024-01-02 NOTE — Telephone Encounter (Signed)
 Called patient left message on personal voice mail to call back.

## 2024-01-02 NOTE — Telephone Encounter (Signed)
 Spoke to patient she stated she already stopped Crestor  for 3 weeks.Pain resolved.She restarted Crestor  last night and had severe muscle pain.Advised I will send message to Dr.Hilty for advice.

## 2024-01-02 NOTE — Telephone Encounter (Signed)
 Called patient left Dr.Hilty's advice on personal voice mail.

## 2024-01-03 ENCOUNTER — Other Ambulatory Visit (HOSPITAL_COMMUNITY): Payer: Self-pay

## 2024-01-03 MED ORDER — ATORVASTATIN CALCIUM 40 MG PO TABS
40.0000 mg | ORAL_TABLET | Freq: Every day | ORAL | 3 refills | Status: DC
  Filled 2024-01-03: qty 90, 90d supply, fill #0

## 2024-01-03 NOTE — Telephone Encounter (Signed)
Pt returning nurse call. Please advise.

## 2024-01-03 NOTE — Addendum Note (Signed)
 Addended by: Margarite Shearer R on: 01/03/2024 12:03 PM   Modules accepted: Orders

## 2024-01-03 NOTE — Telephone Encounter (Signed)
 Spoke with patient and she will stop crestor  and start lipitor 40 mg. She will monitor for the same side effects. She verbalized understanding

## 2024-01-04 ENCOUNTER — Other Ambulatory Visit (HOSPITAL_COMMUNITY): Payer: Self-pay

## 2024-01-11 ENCOUNTER — Ambulatory Visit: Admitting: Cardiology

## 2024-01-28 ENCOUNTER — Other Ambulatory Visit (HOSPITAL_COMMUNITY): Payer: Self-pay

## 2024-01-28 ENCOUNTER — Encounter: Payer: Self-pay | Admitting: Family Medicine

## 2024-01-28 DIAGNOSIS — E782 Mixed hyperlipidemia: Secondary | ICD-10-CM

## 2024-01-28 MED ORDER — PRAVASTATIN SODIUM 40 MG PO TABS
40.0000 mg | ORAL_TABLET | Freq: Every day | ORAL | 1 refills | Status: DC
  Filled 2024-01-28: qty 90, 90d supply, fill #0
  Filled 2024-05-04: qty 90, 90d supply, fill #1

## 2024-04-09 ENCOUNTER — Other Ambulatory Visit: Payer: Self-pay | Admitting: Family Medicine

## 2024-04-09 ENCOUNTER — Other Ambulatory Visit (HOSPITAL_COMMUNITY): Payer: Self-pay

## 2024-04-09 DIAGNOSIS — I10 Essential (primary) hypertension: Secondary | ICD-10-CM

## 2024-04-09 MED ORDER — IRBESARTAN 150 MG PO TABS
150.0000 mg | ORAL_TABLET | Freq: Every day | ORAL | 0 refills | Status: DC
  Filled 2024-04-09: qty 30, 30d supply, fill #0

## 2024-04-21 ENCOUNTER — Encounter: Payer: Self-pay | Admitting: Internal Medicine

## 2024-04-21 ENCOUNTER — Ambulatory Visit: Attending: Internal Medicine | Admitting: Internal Medicine

## 2024-04-22 ENCOUNTER — Encounter: Payer: Self-pay | Admitting: Internal Medicine

## 2024-04-23 ENCOUNTER — Other Ambulatory Visit: Payer: Self-pay | Admitting: Family Medicine

## 2024-04-23 ENCOUNTER — Other Ambulatory Visit (HOSPITAL_COMMUNITY): Payer: Self-pay

## 2024-04-23 MED ORDER — METOPROLOL TARTRATE 25 MG PO TABS
25.0000 mg | ORAL_TABLET | Freq: Two times a day (BID) | ORAL | 0 refills | Status: DC
  Filled 2024-04-23: qty 180, 90d supply, fill #0

## 2024-05-04 ENCOUNTER — Other Ambulatory Visit: Payer: Self-pay | Admitting: Family Medicine

## 2024-05-04 DIAGNOSIS — I10 Essential (primary) hypertension: Secondary | ICD-10-CM

## 2024-05-05 ENCOUNTER — Other Ambulatory Visit: Payer: Self-pay

## 2024-05-05 ENCOUNTER — Other Ambulatory Visit (HOSPITAL_COMMUNITY): Payer: Self-pay

## 2024-05-05 MED ORDER — IRBESARTAN 150 MG PO TABS
150.0000 mg | ORAL_TABLET | Freq: Every day | ORAL | 0 refills | Status: DC
  Filled 2024-05-05: qty 90, 90d supply, fill #0

## 2024-05-12 ENCOUNTER — Other Ambulatory Visit (HOSPITAL_COMMUNITY): Payer: Self-pay

## 2024-05-12 ENCOUNTER — Encounter: Payer: Self-pay | Admitting: Family Medicine

## 2024-05-12 ENCOUNTER — Ambulatory Visit: Admitting: Family Medicine

## 2024-05-12 ENCOUNTER — Other Ambulatory Visit: Payer: Self-pay

## 2024-05-12 VITALS — BP 132/84 | HR 65 | Temp 98.5°F | Ht 64.0 in | Wt 182.0 lb

## 2024-05-12 DIAGNOSIS — Z85820 Personal history of malignant melanoma of skin: Secondary | ICD-10-CM

## 2024-05-12 DIAGNOSIS — I2510 Atherosclerotic heart disease of native coronary artery without angina pectoris: Secondary | ICD-10-CM | POA: Diagnosis not present

## 2024-05-12 DIAGNOSIS — I10 Essential (primary) hypertension: Secondary | ICD-10-CM | POA: Diagnosis not present

## 2024-05-12 DIAGNOSIS — L308 Other specified dermatitis: Secondary | ICD-10-CM

## 2024-05-12 DIAGNOSIS — E782 Mixed hyperlipidemia: Secondary | ICD-10-CM

## 2024-05-12 MED ORDER — NITROGLYCERIN 0.4 MG SL SUBL
0.4000 mg | SUBLINGUAL_TABLET | SUBLINGUAL | 2 refills | Status: AC | PRN
  Filled 2024-05-12: qty 25, 1d supply, fill #0

## 2024-05-12 MED ORDER — IRBESARTAN 150 MG PO TABS
150.0000 mg | ORAL_TABLET | Freq: Every day | ORAL | 1 refills | Status: AC
  Filled 2024-05-12 – 2024-08-01 (×3): qty 90, 90d supply, fill #0

## 2024-05-12 MED ORDER — PRAVASTATIN SODIUM 40 MG PO TABS
40.0000 mg | ORAL_TABLET | Freq: Every day | ORAL | 1 refills | Status: AC
  Filled 2024-05-12 – 2024-08-11 (×3): qty 90, 90d supply, fill #0

## 2024-05-12 MED ORDER — TRIAMCINOLONE ACETONIDE 0.1 % EX CREA
1.0000 | TOPICAL_CREAM | Freq: Two times a day (BID) | CUTANEOUS | 5 refills | Status: AC
  Filled 2024-05-12: qty 30, 15d supply, fill #0

## 2024-05-12 MED ORDER — METOPROLOL TARTRATE 25 MG PO TABS
25.0000 mg | ORAL_TABLET | Freq: Two times a day (BID) | ORAL | 1 refills | Status: AC
  Filled 2024-05-12 – 2024-08-01 (×2): qty 180, 90d supply, fill #0

## 2024-05-12 NOTE — Assessment & Plan Note (Signed)
 BP is well controlled today, continue medications listed below as prescribed. Current hypertension medications:       Sig   irbesartan  (AVAPRO ) 150 MG tablet Take 1 tablet (150 mg total) by mouth daily.   metoprolol  tartrate (LOPRESSOR ) 25 MG tablet Take 1 tablet (25 mg total) by mouth 2 (two) times daily.

## 2024-05-12 NOTE — Progress Notes (Signed)
 Established Patient Office Visit  Subjective   Patient ID: Megan Kelley, female    DOB: 1970/11/12  Age: 53 y.o. MRN: [id number]  Chief Complaint  Patient presents with   Medical Management of Chronic Issues    HPI Discussed the use of AI scribe software for clinical note transcription with the patient, who gave verbal consent to proceed.  History of Present Illness   Megan Kelley is a 53 year old female with coronary artery disease who presents for a follow-up visit regarding medication management.  She is having difficulty obtaining Wegovy  due to insurance coverage issues and is frustrated with weight management, noting an increase in weight despite a healthy diet. She is currently taking pravastatin  40 mg without muscle cramps. Her last lipid panel in February showed an LDL of 70 and HDL of 53, however these numbers reflect when she was previously on rosuvastatin . She has not experienced recent chest pain and has nitroglycerin  on hand, although it is expired and unused for over two years. No recent dizziness during episodes of chest pressure. She is seeking a new dermatologist due to dissatisfaction with her current provider and has had multiple skin lesions/melanoma removed in the past.       Current Outpatient Medications  Medication Instructions   aspirin  EC 81 mg, Oral, Daily, Swallow whole.   betamethasone  dipropionate 0.05 % cream Apply to affected area up to twice daily as needed. Not to face, groin or underarms.   esomeprazole (NEXIUM) 20 mg, Daily PRN   irbesartan  (AVAPRO ) 150 mg, Oral, Daily   metoprolol  tartrate (LOPRESSOR ) 25 mg, Oral, 2 times daily   nitroGLYCERIN  (NITROSTAT ) 0.4 mg, Sublingual, Every 5 min PRN   pravastatin  (PRAVACHOL ) 40 mg, Oral, Daily   triamcinolone  cream (KENALOG ) 0.1 % 1 Application, Topical, 2 times daily    Patient Active Problem List   Diagnosis Date Noted   Eczema 03/23/2023   CAD (coronary artery disease) 10/25/2021    Hyperlipidemia 10/25/2021   Hypertension 10/25/2021   Melanoma of skin (HCC) 01/17/2019     Review of Systems  All other systems reviewed and are negative.     Objective:     BP 132/84   Pulse 65   Temp 98.5 F (36.9 C) (Oral)   Ht 5' 4 (1.626 m)   Wt 182 lb (82.6 kg)   LMP 10/25/2019   SpO2 98%   BMI 31.24 kg/m    Physical Exam Vitals reviewed.  Constitutional:      Appearance: Normal appearance. She is well-groomed. She is obese.  Neck:     Thyroid : No thyromegaly.  Cardiovascular:     Rate and Rhythm: Normal rate and regular rhythm.     Heart sounds: S1 normal and S2 normal.  Pulmonary:     Effort: Pulmonary effort is normal.     Breath sounds: Normal breath sounds and air entry.  Musculoskeletal:     Right lower leg: No edema.     Left lower leg: No edema.  Neurological:     Mental Status: She is alert and oriented to person, place, and time. Mental status is at baseline.     Gait: Gait is intact.  Psychiatric:        Mood and Affect: Mood and affect normal.        Speech: Speech normal.        Behavior: Behavior normal.        Judgment: Judgment normal.      No  results found for any visits on 05/12/24.    The 10-year ASCVD risk score (Arnett DK, et al., 2019) is: 1.8%    Assessment & Plan:  Coronary artery disease involving native coronary artery of native heart without angina pectoris -     Metoprolol  Tartrate; Take 1 tablet (25 mg total) by mouth 2 (two) times daily.  Dispense: 180 tablet; Refill: 1 -     Nitroglycerin ; Place 1 tablet (0.4 mg total) under the tongue every 5 (five) minutes as needed.  Dispense: 25 tablet; Refill: 2  Primary hypertension Assessment & Plan: BP is well controlled today, continue medications listed below as prescribed. Current hypertension medications:       Sig   irbesartan  (AVAPRO ) 150 MG tablet Take 1 tablet (150 mg total) by mouth daily.   metoprolol  tartrate (LOPRESSOR ) 25 MG tablet Take 1 tablet (25  mg total) by mouth 2 (two) times daily.        Orders: -     Irbesartan ; Take 1 tablet (150 mg total) by mouth daily.  Dispense: 90 tablet; Refill: 1  Mixed hyperlipidemia -     Pravastatin  Sodium; Take 1 tablet (40 mg total) by mouth daily.  Dispense: 90 tablet; Refill: 1 -     Lipid panel; Future -     Hepatic function panel; Future  Other eczema -     Triamcinolone  Acetonide; Apply 1 Application topically 2 (two) times daily.  Dispense: 30 g; Refill: 5 -     Ambulatory referral to Dermatology  History of melanoma -     Ambulatory referral to Dermatology   Assessment and Plan    Coronary artery disease Well-managed with no recent episodes of chest pain, dizziness, or significant cardiac symptoms. Previous nitroglycerin  prescription expired and unused for over two years. - Prescribe nitroglycerin  for emergency use.  Mixed hyperlipidemia Managed with pravastatin . Previous muscle cramps on a different statin resolved. Last lipid panel was done while she was on rosuvastatin  so will order a new lipid panel to assess how she is doing on the pravastatin .  - Order lipid panel and hepatic function panel to assess current lipid levels and monitor liver function due to statin use. - Continue pravastatin  at current dose of 40 mg.  Obesity/CAD Management complicated by insurance coverage issues for GLP-1 receptor agonists like Wegovy . Experiencing mental distress due to weight gain despite healthy eating habits. Exploring insurance appeal process for coverage due to coronary artery disease. - Develop a medical necessity letter for insurance appeal for GLP-1 coverage. - Advise to check insurance plans during re-enrollment for potential GLP-1 coverage.        Return in about 6 months (around 11/10/2024) for annual physical exam.    Heron CHRISTELLA Sharper, MD

## 2024-05-13 ENCOUNTER — Other Ambulatory Visit: Payer: Self-pay | Admitting: Family Medicine

## 2024-05-13 DIAGNOSIS — Z1231 Encounter for screening mammogram for malignant neoplasm of breast: Secondary | ICD-10-CM

## 2024-05-14 ENCOUNTER — Other Ambulatory Visit (HOSPITAL_COMMUNITY): Payer: Self-pay

## 2024-05-19 ENCOUNTER — Encounter: Payer: Self-pay | Admitting: Dermatology

## 2024-05-19 ENCOUNTER — Ambulatory Visit: Admitting: Dermatology

## 2024-05-19 VITALS — BP 131/85

## 2024-05-19 DIAGNOSIS — D229 Melanocytic nevi, unspecified: Secondary | ICD-10-CM

## 2024-05-19 DIAGNOSIS — D485 Neoplasm of uncertain behavior of skin: Secondary | ICD-10-CM | POA: Diagnosis not present

## 2024-05-19 DIAGNOSIS — L821 Other seborrheic keratosis: Secondary | ICD-10-CM | POA: Diagnosis not present

## 2024-05-19 DIAGNOSIS — Z86006 Personal history of melanoma in-situ: Secondary | ICD-10-CM | POA: Diagnosis not present

## 2024-05-19 DIAGNOSIS — D1801 Hemangioma of skin and subcutaneous tissue: Secondary | ICD-10-CM | POA: Diagnosis not present

## 2024-05-19 DIAGNOSIS — L814 Other melanin hyperpigmentation: Secondary | ICD-10-CM | POA: Diagnosis not present

## 2024-05-19 DIAGNOSIS — Z1283 Encounter for screening for malignant neoplasm of skin: Secondary | ICD-10-CM | POA: Diagnosis not present

## 2024-05-19 DIAGNOSIS — D239 Other benign neoplasm of skin, unspecified: Secondary | ICD-10-CM

## 2024-05-19 DIAGNOSIS — L918 Other hypertrophic disorders of the skin: Secondary | ICD-10-CM

## 2024-05-19 DIAGNOSIS — L578 Other skin changes due to chronic exposure to nonionizing radiation: Secondary | ICD-10-CM | POA: Diagnosis not present

## 2024-05-19 DIAGNOSIS — W908XXA Exposure to other nonionizing radiation, initial encounter: Secondary | ICD-10-CM

## 2024-05-19 DIAGNOSIS — D2261 Melanocytic nevi of right upper limb, including shoulder: Secondary | ICD-10-CM

## 2024-05-19 HISTORY — DX: Other benign neoplasm of skin, unspecified: D23.9

## 2024-05-19 NOTE — Progress Notes (Signed)
 New Patient Visit   Subjective  Megan Kelley is a 53 y.o. 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 The patient has spots, moles and lesions to be evaluated, some may be new or changing and the patient may have concern these could be cancer.    The following portions of the chart were reviewed this encounter and updated as appropriate: medications, allergies, medical history  Review of Systems:  No other skin or systemic complaints except as noted in HPI or Assessment and Plan.  Objective  Well appearing patient in no apparent distress; mood and affect are within normal limits.  A full examination was performed including scalp, head, eyes, ears, nose, lips, neck, chest, axillae, abdomen, back, buttocks, bilateral upper extremities, bilateral lower extremities, hands, feet, fingers, toes, fingernails, and toenails. All findings within normal limits unless otherwise noted below.   Relevant physical exam findings are noted in the Assessment and Plan.  Right Ant Shoulder 0.5 cm irregular brown macule  Right Upper Arm 0.3 cm irregular brown macule  Left dorsal 3rd finger 1.0 cm irregular brown macule   Assessment & Plan   SKIN CANCER SCREENING PERFORMED TODAY.  ACTINIC DAMAGE - Chronic condition, secondary to cumulative UV/sun exposure - diffuse scaly erythematous macules with underlying dyspigmentation - Recommend daily broad spectrum sunscreen SPF 30+ to sun-exposed areas, reapply every 2 hours as needed.  - Staying in the shade or wearing long sleeves, sun glasses (UVA+UVB protection) and wide brim hats (4-inch brim around the entire circumference of the hat) are also recommended for sun protection.  - Call for new or changing lesions.  LENTIGINES, SEBORRHEIC KERATOSES, HEMANGIOMAS -  Benign normal skin lesions - Benign-appearing - Call for any changes  MELANOCYTIC NEVI - Tan-brown and/or pink-flesh-colored symmetric macules and papules - Benign appearing on exam today - Observation - Call clinic for new or changing moles - Recommend daily use of broad spectrum spf 30+ sunscreen to sun-exposed areas.        NEOPLASM OF UNCERTAIN BEHAVIOR OF SKIN (3) Right Ant Shoulder Epidermal / dermal shaving  Lesion diameter (cm):  0.5 Informed consent: discussed and consent obtained   Timeout: patient name, date of birth, surgical site, and procedure verified   Procedure prep:  Patient was prepped and draped in usual sterile fashion Prep type:  Isopropyl alcohol Anesthesia: the lesion was anesthetized in a standard fashion   Anesthetic:  1% lidocaine  w/ epinephrine 1-100,000 buffered w/ 8.4% NaHCO3 Instrument used: flexible razor blade   Hemostasis achieved with: pressure, aluminum chloride and electrodesiccation   Outcome: patient tolerated procedure well   Post-procedure details: sterile dressing applied and wound care instructions given   Dressing type: bandage and petrolatum     Specimen 1 - Surgical pathology Differential Diagnosis: R/O Dysplastic nevus  Check Margins: No Right Upper Arm Epidermal / dermal shaving  Lesion diameter (cm):  0.3 Informed consent: discussed and consent obtained   Timeout: patient name, date of birth, surgical site, and procedure verified   Procedure prep:  Patient was prepped and draped in usual sterile fashion Prep type:  Isopropyl alcohol Anesthesia: the lesion was anesthetized in a standard fashion   Anesthetic:  1% lidocaine  w/ epinephrine 1-100,000 buffered w/ 8.4% NaHCO3 Instrument used: flexible razor blade   Hemostasis achieved with: pressure, aluminum chloride and  electrodesiccation   Outcome: patient tolerated procedure well   Post-procedure details: sterile dressing applied and wound care instructions given    Dressing type: bandage and petrolatum     Specimen 2 - Surgical pathology Differential Diagnosis: R/O dysplastic nevus  Check Margins: No Left dorsal 3rd finger Epidermal / dermal shaving  Lesion diameter (cm):  1 Informed consent: discussed and consent obtained   Timeout: patient name, date of birth, surgical site, and procedure verified   Procedure prep:  Patient was prepped and draped in usual sterile fashion Prep type:  Isopropyl alcohol Anesthesia: the lesion was anesthetized in a standard fashion   Anesthetic:  1% lidocaine  w/ epinephrine 1-100,000 buffered w/ 8.4% NaHCO3 Instrument used: flexible razor blade   Hemostasis achieved with: pressure, aluminum chloride and electrodesiccation   Outcome: patient tolerated procedure well   Post-procedure details: sterile dressing applied and wound care instructions given   Dressing type: bandage and petrolatum     Specimen 3 - Surgical pathology Differential Diagnosis: R/O dysplastic nevus   Check Margins: No  HISTORY OF MELANOMA IN SITU - No evidence of recurrence today - Recommend regular full body skin exams - Recommend daily broad spectrum sunscreen SPF 30+ to sun-exposed areas, reapply every 2 hours as needed.  - Call if any new or changing lesions are noted between office visits    Return in about 1 year (around 05/19/2025) for TBSE.  I, Roseline Hutchinson, CMA, am acting as scribe for [employer], DO .   Documentation: I have reviewed the above documentation for accuracy and completeness, and I agree with the above.  Delon Lenis, DO

## 2024-05-19 NOTE — Patient Instructions (Addendum)

## 2024-05-20 ENCOUNTER — Ambulatory Visit

## 2024-05-20 DIAGNOSIS — Z1231 Encounter for screening mammogram for malignant neoplasm of breast: Secondary | ICD-10-CM

## 2024-05-21 LAB — SURGICAL PATHOLOGY

## 2024-05-22 ENCOUNTER — Ambulatory Visit: Payer: Self-pay | Admitting: Dermatology

## 2024-05-22 NOTE — Progress Notes (Signed)
 HI Shirron,  Please call pt and notify that one of their bx results showed an abnormal mole that requires a full excision in office with Dr Corey  Diagnosis 1. Skin , right ant shoulder  --> SE with Dr Corey JUNCTIONAL DYSPLASTIC MELANOCYTIC NEVUS WITH MODERATE TO SEVERE ATYPIA, PERIPHERAL MARGIN INVOLVED, SEE DESCRIPTION  2. Skin , right upper arm DYSPLASTIC JUNCTIONAL NEVUS WITH MODERATE ATYPIA, LIMITED MARGINS FREE  3. Skin , left dorsal 3rd finger PIGMENTED SEBORRHEIC KERATOSIS

## 2024-05-23 ENCOUNTER — Ambulatory Visit: Payer: Self-pay | Admitting: Family Medicine

## 2024-05-23 ENCOUNTER — Other Ambulatory Visit (INDEPENDENT_AMBULATORY_CARE_PROVIDER_SITE_OTHER)

## 2024-05-23 DIAGNOSIS — E782 Mixed hyperlipidemia: Secondary | ICD-10-CM

## 2024-05-23 LAB — HEPATIC FUNCTION PANEL
ALT: 21 U/L (ref 0–35)
AST: 15 U/L (ref 0–37)
Albumin: 4.7 g/dL (ref 3.5–5.2)
Alkaline Phosphatase: 77 U/L (ref 39–117)
Bilirubin, Direct: 0.1 mg/dL (ref 0.0–0.3)
Total Bilirubin: 0.6 mg/dL (ref 0.2–1.2)
Total Protein: 7.4 g/dL (ref 6.0–8.3)

## 2024-05-23 LAB — LIPID PANEL
Cholesterol: 166 mg/dL (ref 0–200)
HDL: 52.5 mg/dL (ref >39.00)
LDL Cholesterol: 94 mg/dL (ref 0–99)
NonHDL: 113.77
Total CHOL/HDL Ratio: 3
Triglycerides: 99 mg/dL (ref 0.0–149.0)
VLDL: 19.8 mg/dL (ref 0.0–40.0)

## 2024-05-23 NOTE — Addendum Note (Signed)
 Addended by: Fitz Matsuo K on: 05/23/2024 07:35 AM   Modules accepted: Orders

## 2024-05-23 NOTE — Addendum Note (Signed)
 Addended by: BILLIE GRAYCE POUR on: 05/23/2024 07:31 AM   Modules accepted: Orders

## 2024-05-27 ENCOUNTER — Encounter: Payer: Self-pay | Admitting: Dermatology

## 2024-06-04 ENCOUNTER — Ambulatory Visit
Admission: RE | Admit: 2024-06-04 | Discharge: 2024-06-04 | Disposition: A | Source: Ambulatory Visit | Attending: Family Medicine

## 2024-06-04 DIAGNOSIS — Z1231 Encounter for screening mammogram for malignant neoplasm of breast: Secondary | ICD-10-CM | POA: Diagnosis not present

## 2024-06-10 ENCOUNTER — Ambulatory Visit: Payer: Self-pay | Admitting: Family Medicine

## 2024-06-23 ENCOUNTER — Other Ambulatory Visit (HOSPITAL_COMMUNITY): Payer: Self-pay

## 2024-07-21 ENCOUNTER — Other Ambulatory Visit (HOSPITAL_COMMUNITY): Payer: Self-pay

## 2024-07-30 ENCOUNTER — Other Ambulatory Visit (HOSPITAL_COMMUNITY): Payer: Self-pay

## 2024-08-01 ENCOUNTER — Other Ambulatory Visit (HOSPITAL_COMMUNITY): Payer: Self-pay

## 2024-08-11 ENCOUNTER — Encounter (HOSPITAL_COMMUNITY): Payer: Self-pay | Admitting: Pharmacist

## 2024-08-11 ENCOUNTER — Other Ambulatory Visit (HOSPITAL_COMMUNITY): Payer: Self-pay

## 2024-08-11 ENCOUNTER — Encounter: Admitting: Dermatology

## 2025-05-20 ENCOUNTER — Ambulatory Visit: Admitting: Dermatology
